# Patient Record
Sex: Female | Born: 1995 | Race: White | Hispanic: No | Marital: Single | Smoking: Never smoker
Health system: Southern US, Community
[De-identification: ages and names within clinical notes are randomized; demographics above are authoritative.]

## PROBLEM LIST (undated history)

## (undated) DIAGNOSIS — G43109 Migraine with aura, not intractable, without status migrainosus: Secondary | ICD-10-CM

## (undated) DIAGNOSIS — R87610 Atypical squamous cells of undetermined significance on cytologic smear of cervix (ASC-US): Secondary | ICD-10-CM

## (undated) DIAGNOSIS — N83209 Unspecified ovarian cyst, unspecified side: Secondary | ICD-10-CM

## (undated) DIAGNOSIS — J45909 Unspecified asthma, uncomplicated: Secondary | ICD-10-CM

## (undated) DIAGNOSIS — Z3480 Encounter for supervision of other normal pregnancy, unspecified trimester: Secondary | ICD-10-CM

## (undated) HISTORY — PX: DILATION AND CURETTAGE OF UTERUS: SHX78

## (undated) HISTORY — DX: Migraine with aura, not intractable, without status migrainosus: G43.109

## (undated) HISTORY — DX: Unspecified ovarian cyst, unspecified side: N83.209

---
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
## 2013-04-13 IMAGING — CR DG LUMBAR SPINE AP/LAT/OBLIQUES W/ FLEX AND EXT
1 series · 5 of 5 positions shown · non-contrast
Comparison: none

REASON FOR EXAM: back pain
COMMENTS:

PROCEDURE:     DXR - DXR LUMBAR SPINE WITH OBLIQUES  - September 07, 2011  [DATE]
RESULT:     Comparison: None.

[Series 1: t lumbar spine ap · 0.14mm/px · 5 of 5 slices shown]
[im 1/5]
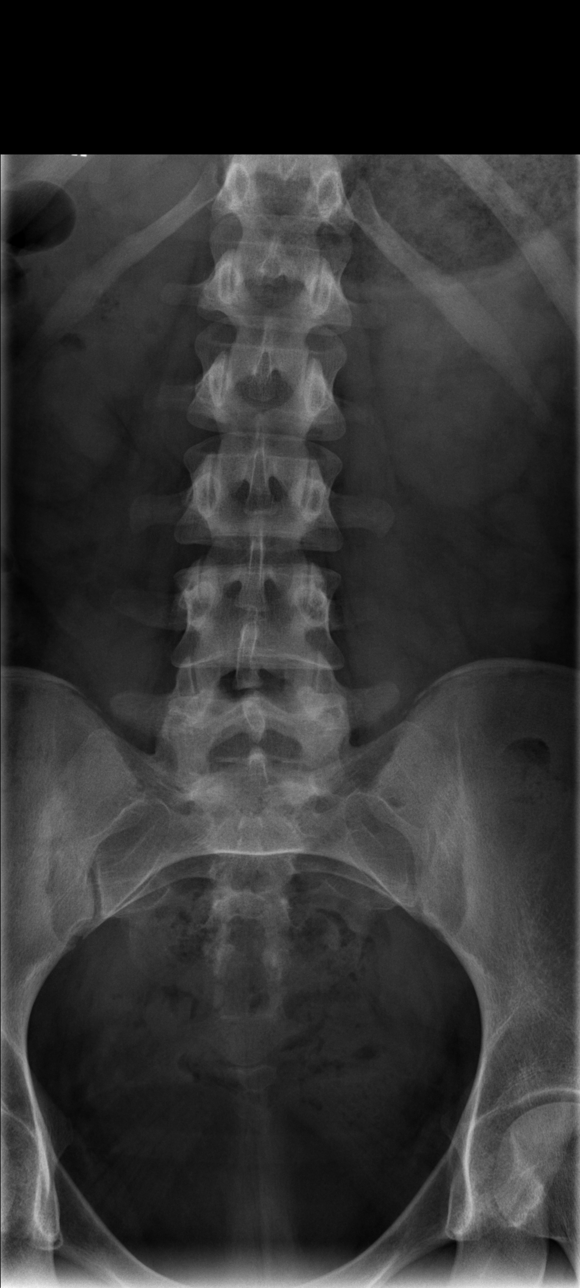
[im 2/5]
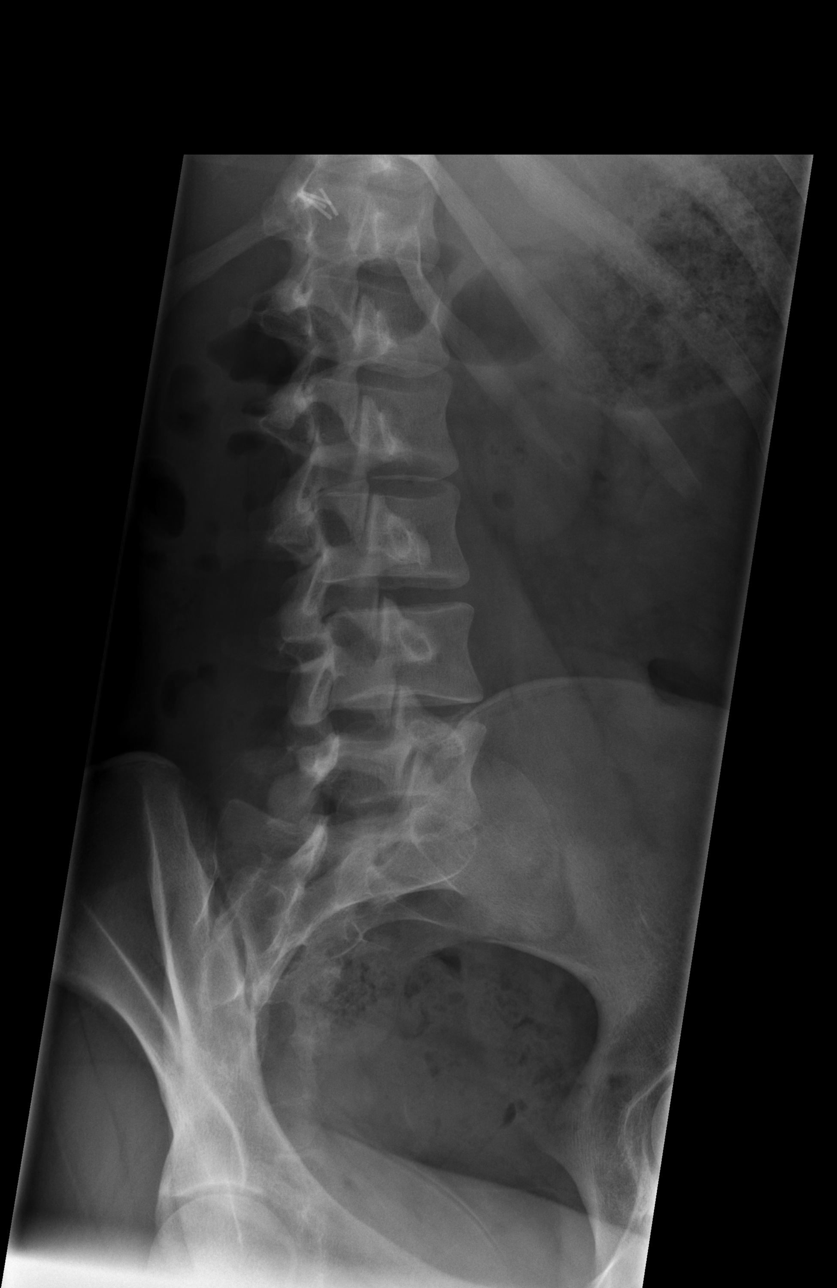
[im 3/5]
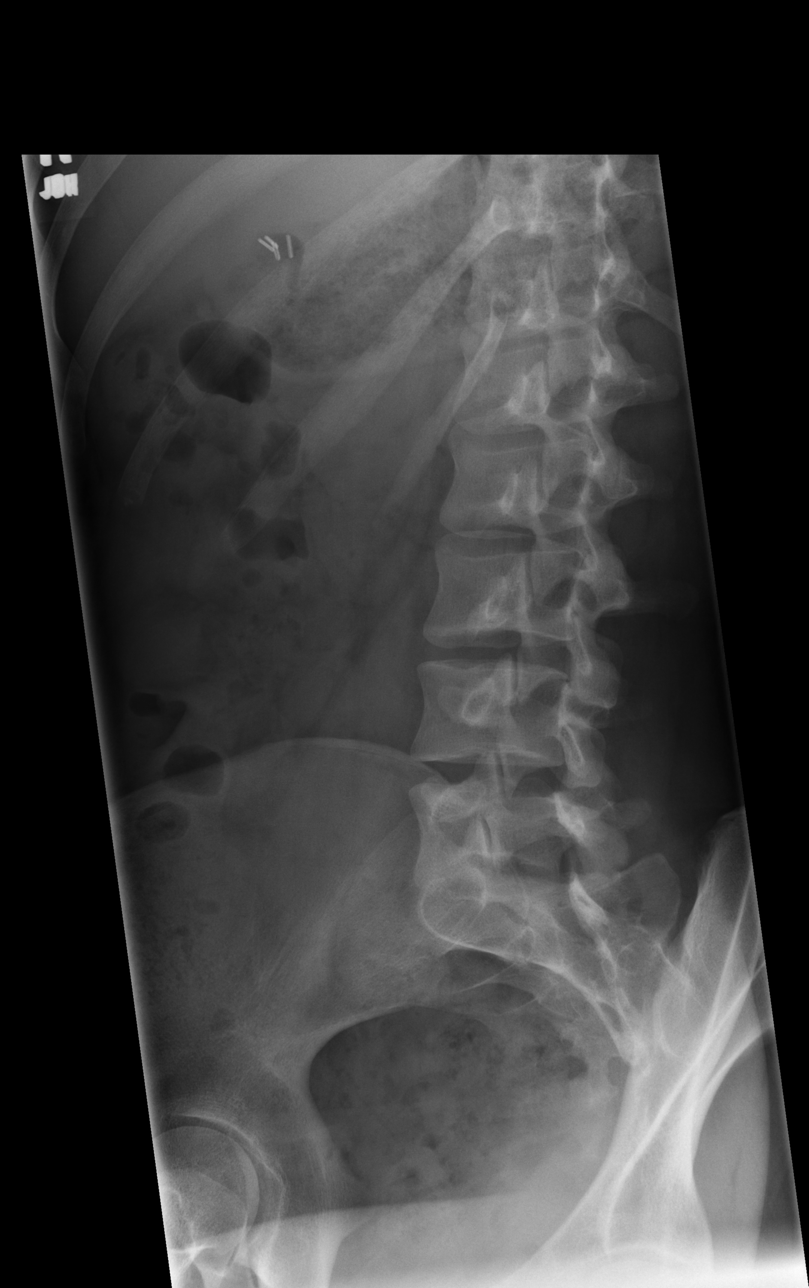
[im 4/5]
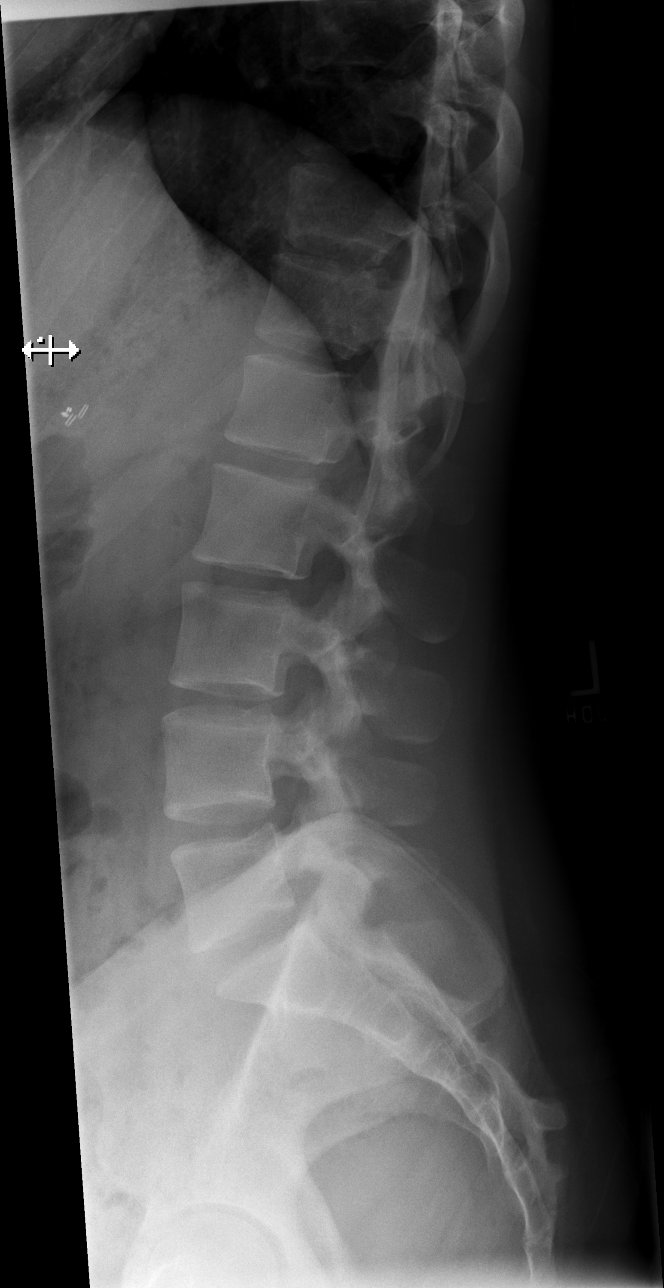
[im 5/5]
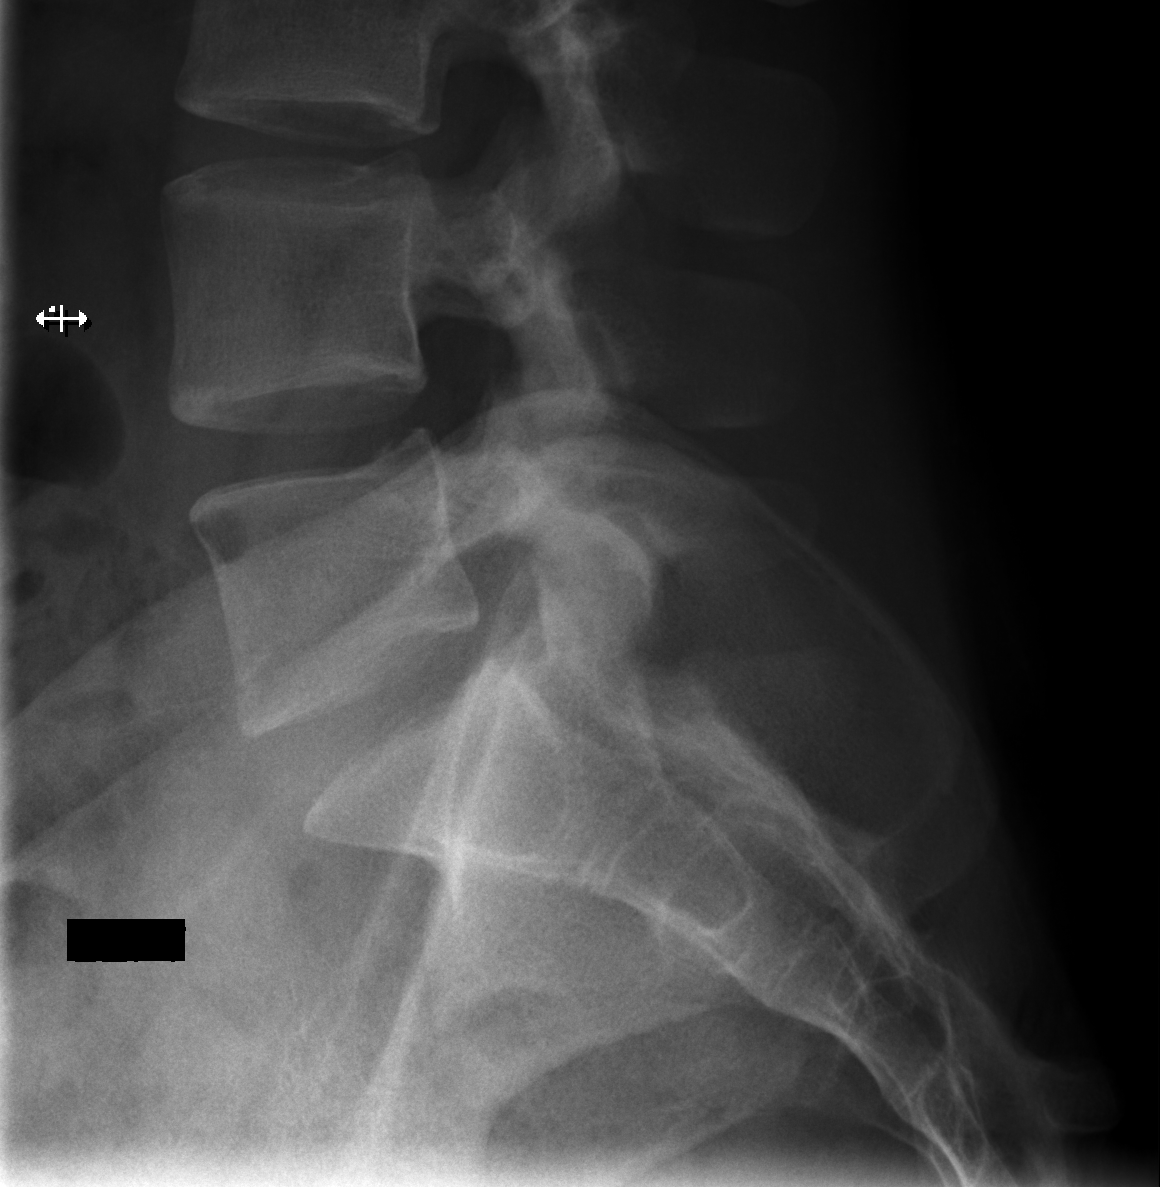

[5 of 5 positions shown; findings below may reference images not displayed]

FINDINGS: There are 5 lumbar type vertebral bodies. No pars defects seen on the
oblique views. Vertebral body heights and intervertebral disc heights are
preserved. There is normal alignment. There is a minimal dextrocurvature of
the lumbar spine, which may be positional.
IMPRESSION: No acute findings.

## 2013-04-27 ENCOUNTER — Inpatient Hospital Stay: Payer: Self-pay

## 2013-04-27 LAB — CBC WITH DIFFERENTIAL/PLATELET
Basophil %: 0.6 %
HCT: 31.4 % — ABNORMAL LOW (ref 35.0–47.0)
Lymphocyte #: 1.5 10*3/uL (ref 1.0–3.6)
Lymphocyte %: 22.4 %
MCH: 27.6 pg (ref 26.0–34.0)
MCV: 80 fL (ref 80–100)
Monocyte #: 0.5 x10 3/mm (ref 0.2–0.9)
WBC: 6.8 10*3/uL (ref 3.6–11.0)

## 2013-04-27 LAB — GC/CHLAMYDIA PROBE AMP

## 2013-04-28 LAB — HEMATOCRIT: HCT: 31.4 % — ABNORMAL LOW (ref 35.0–47.0)

## 2013-05-05 ENCOUNTER — Emergency Department: Payer: Self-pay | Admitting: Emergency Medicine

## 2013-05-06 LAB — COMPREHENSIVE METABOLIC PANEL
Bilirubin,Total: 0.5 mg/dL (ref 0.2–1.0)
Calcium, Total: 8.9 mg/dL — ABNORMAL LOW (ref 9.0–10.7)
Chloride: 106 mmol/L (ref 98–107)
EGFR (Non-African Amer.): >60
Glucose: 85 mg/dL (ref 65–99)
Osmolality: 284 (ref 275–301)
Potassium: 3.5 mmol/L (ref 3.5–5.1)
Sodium: 142 mmol/L (ref 136–145)
Total Protein: 7.2 g/dL (ref 6.4–8.6)

## 2013-05-06 LAB — CBC
HCT: 36.6 % (ref 35.0–47.0)
HGB: 12.4 g/dL (ref 12.0–16.0)
MCH: 27.8 pg (ref 26.0–34.0)
Platelet: 251 10*3/uL (ref 150–440)
RBC: 4.47 10*6/uL (ref 3.80–5.20)
RDW: 13.9 % (ref 11.5–14.5)
WBC: 6 10*3/uL (ref 3.6–11.0)

## 2013-05-06 LAB — URINALYSIS, COMPLETE
Bilirubin,UR: NEGATIVE
Blood: NEGATIVE
Ketone: NEGATIVE
Ph: 6 (ref 4.5–8.0)
Protein: NEGATIVE
RBC,UR: 2 /HPF (ref 0–5)
Specific Gravity: 1.027 (ref 1.003–1.030)
WBC UR: 3 /HPF (ref 0–5)

## 2013-05-06 LAB — WET PREP, GENITAL

## 2013-05-07 LAB — URINE CULTURE

## 2013-05-10 ENCOUNTER — Emergency Department: Payer: Self-pay | Admitting: Unknown Physician Specialty

## 2013-05-11 LAB — COMPREHENSIVE METABOLIC PANEL
Albumin: 3.7 g/dL — ABNORMAL LOW (ref 3.8–5.6)
Alkaline Phosphatase: 120 U/L (ref 82–169)
Anion Gap: 6 — ABNORMAL LOW (ref 7–16)
Calcium, Total: 9.3 mg/dL (ref 9.0–10.7)
Chloride: 102 mmol/L (ref 98–107)
Co2: 29 mmol/L (ref 21–32)
Creatinine: 0.67 mg/dL (ref 0.60–1.30)
EGFR (Non-African Amer.): >60
Glucose: 90 mg/dL (ref 65–99)
Osmolality: 275 (ref 275–301)
Potassium: 3.6 mmol/L (ref 3.5–5.1)

## 2013-05-11 LAB — URINALYSIS, COMPLETE
Bacteria: NONE SEEN
Ketone: NEGATIVE
Nitrite: NEGATIVE
Protein: NEGATIVE
RBC,UR: 1 /HPF (ref 0–5)

## 2013-05-11 LAB — LIPASE, BLOOD: Lipase: 144 U/L (ref 73–393)

## 2013-05-11 LAB — CBC
HCT: 37.3 % (ref 35.0–47.0)
HGB: 12.9 g/dL (ref 12.0–16.0)
MCHC: 34.7 g/dL (ref 32.0–36.0)
MCV: 80 fL (ref 80–100)
WBC: 5.7 10*3/uL (ref 3.6–11.0)

## 2013-09-07 DIAGNOSIS — N83209 Unspecified ovarian cyst, unspecified side: Secondary | ICD-10-CM

## 2013-09-07 HISTORY — DX: Unspecified ovarian cyst, unspecified side: N83.209

## 2013-09-18 ENCOUNTER — Ambulatory Visit: Payer: Self-pay | Admitting: Family Medicine

## 2013-09-22 ENCOUNTER — Emergency Department: Payer: Self-pay | Admitting: Emergency Medicine

## 2013-09-22 LAB — URINALYSIS, COMPLETE
Blood: NEGATIVE
Ketone: NEGATIVE
Leukocyte Esterase: NEGATIVE
Ph: 6 (ref 4.5–8.0)
Protein: NEGATIVE
RBC,UR: 1 /HPF (ref 0–5)
Specific Gravity: 1.027 (ref 1.003–1.030)

## 2013-09-22 LAB — COMPREHENSIVE METABOLIC PANEL
Albumin: 3.6 g/dL — ABNORMAL LOW (ref 3.8–5.6)
Alkaline Phosphatase: 41 U/L — ABNORMAL LOW
Bilirubin,Total: 0.8 mg/dL (ref 0.2–1.0)
Calcium, Total: 8.9 mg/dL — ABNORMAL LOW (ref 9.0–10.7)
Chloride: 107 mmol/L (ref 98–107)
Co2: 28 mmol/L (ref 21–32)
Creatinine: 0.64 mg/dL (ref 0.60–1.30)
EGFR (African American): >60
EGFR (Non-African Amer.): >60
Glucose: 112 mg/dL — ABNORMAL HIGH (ref 65–99)
Osmolality: 278 (ref 275–301)
Potassium: 3.6 mmol/L (ref 3.5–5.1)
SGOT(AST): 18 U/L (ref 0–26)
SGPT (ALT): 19 U/L (ref 12–78)
Sodium: 139 mmol/L (ref 136–145)
Total Protein: 7 g/dL (ref 6.4–8.6)

## 2013-09-22 LAB — CBC WITH DIFFERENTIAL/PLATELET
Basophil #: 0 10*3/uL (ref 0.0–0.1)
Basophil %: 0.6 %
Eosinophil %: 1 %
MCH: 28.2 pg (ref 26.0–34.0)
MCHC: 34.6 g/dL (ref 32.0–36.0)
MCV: 82 fL (ref 80–100)
Neutrophil #: 4 10*3/uL (ref 1.4–6.5)
Platelet: 186 10*3/uL (ref 150–440)
RBC: 4.51 10*6/uL (ref 3.80–5.20)
RDW: 13.4 % (ref 11.5–14.5)
WBC: 6.8 10*3/uL (ref 3.6–11.0)

## 2013-10-22 IMAGING — US US PELV - US TRANSVAGINAL
1 series · 13 of 25 positions shown · non-contrast
Comparison: none

REASON FOR EXAM: RECENT MISCARRIAGE W/ VAG BLEEDING; EVAL RETAINED POC
COMMENTS:   May transport without cardiac monitor

PROCEDURE:     US  - US PELVIS EXAM W/TRANSVAGINAL  - March 17, 2012  [DATE]
RESULT:
TECHNIQUE: Transabdominal and endovaginal sonographic imaging was performed.

[Series 1: us pelv - us transvaginal · 0.26mm/px · 13 of 72 slices shown]
[im 1/72]
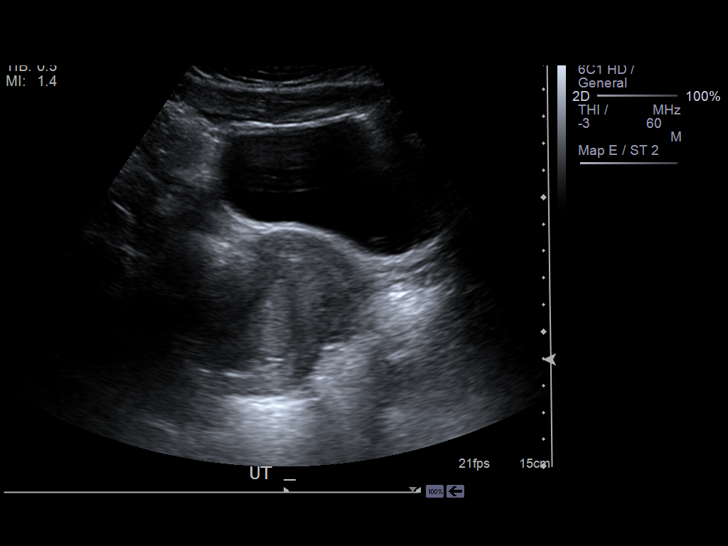
[im 6/72]
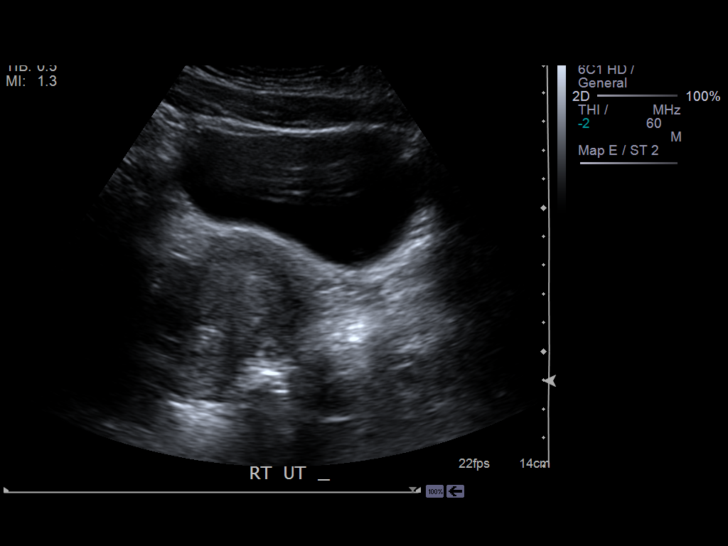
[im 12/72]
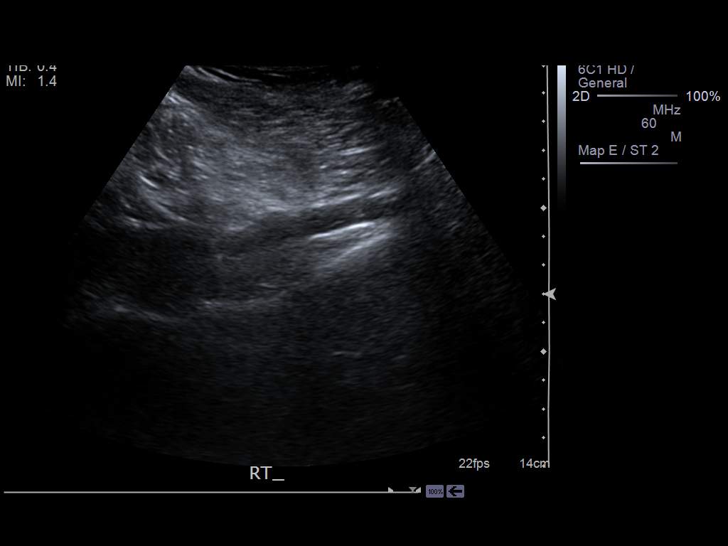
[im 18/72]
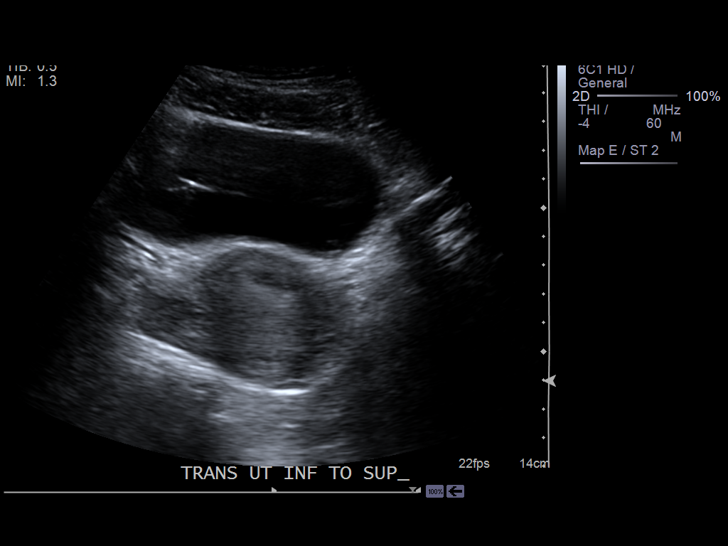
[im 24/72]
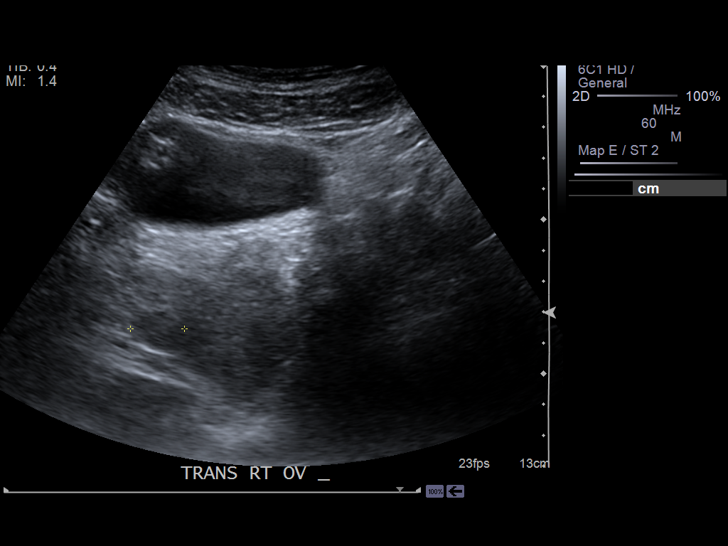
[im 30/72]
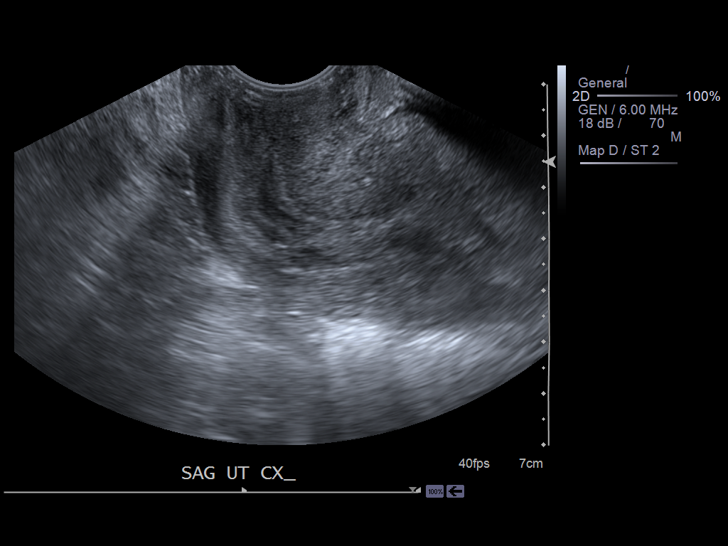
[im 36/72]
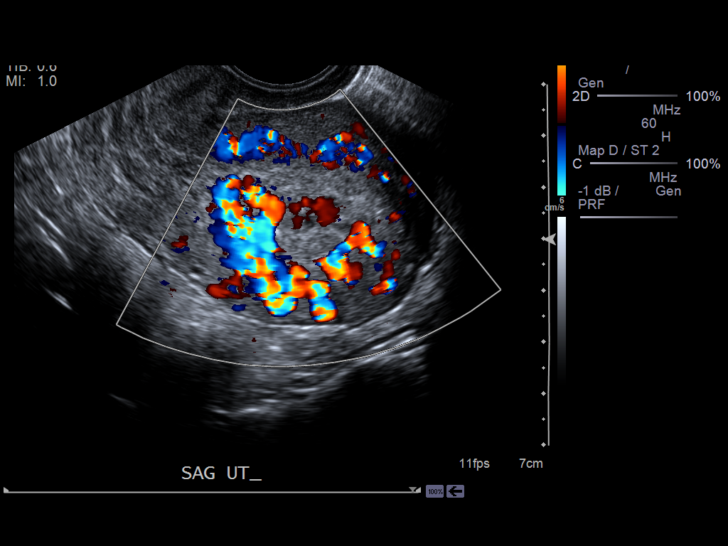
[im 42/72]
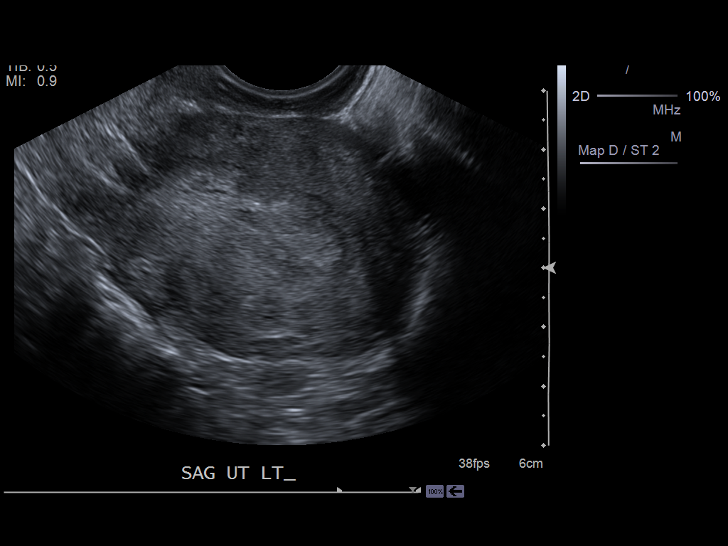
[im 48/72]
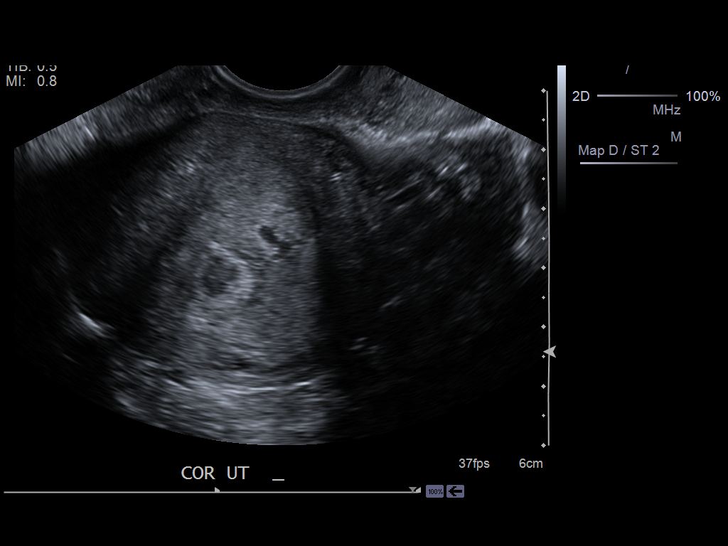
[im 54/72]
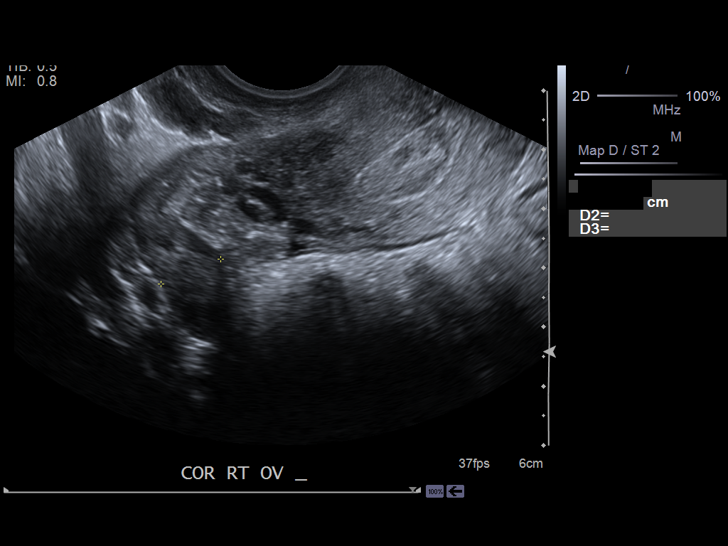
[im 60/72]
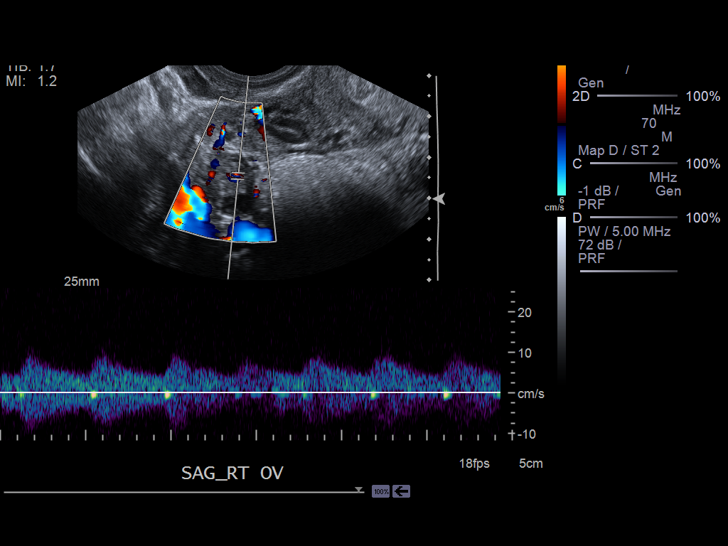
[im 66/72]
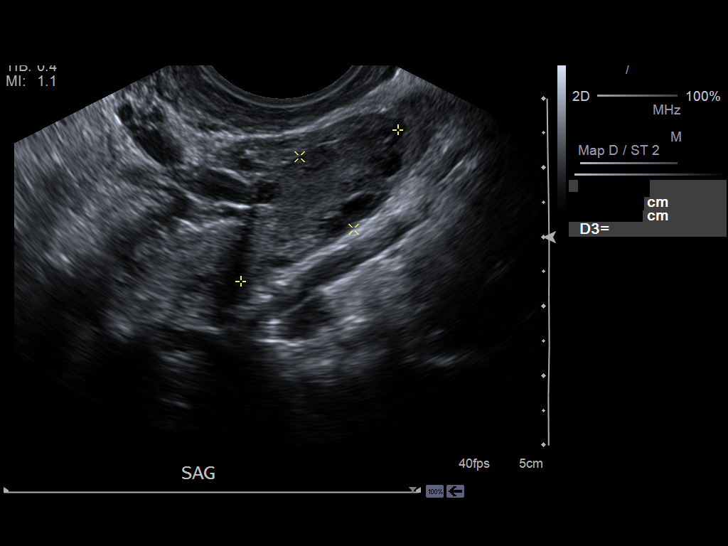
[im 72/72]
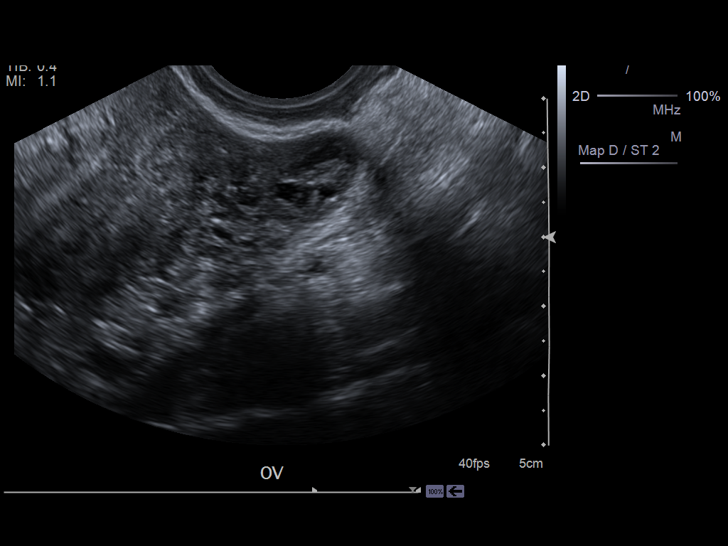

[13 of 25 positions shown; findings below may reference images not displayed]

FINDINGS: The uterus measures 6 cm in length. There is an abnormally
thickened heterogeneous and hypervascular endometrium. The endometrium
measures 2.1 cm. No discrete gestational sac is appreciated. The endometrial
and subendometrial tissues may represent residual products of conception
versus gestational trophoblastic disease. The possibility of endometriosis
is also of diagnostic consideration, if clinically appropriate.

The ovaries are unremarkable. The follicles are appreciated. Doppler flow is
identified within each ovary. Trace free fluid is identified within the
pelvis. There is no evidence of hydronephrosis.
IMPRESSION: 1.     Thickened heterogeneous, hyperemic endometrium. Differential
considerations are retained products of conception. No gestational sac is
appreciated. Etiology such as gestational trophoblastic disease or
endometrioma cannot be excluded, if clinically appropriate.
2.     Dr. Siebelo of the Emergency Department was informed of these findings
via a preliminary faxed report.

## 2014-01-26 IMAGING — US US OB < 14 WEEKS - US OB TV
1 series · 14 of 28 positions shown · non-contrast
Comparison: none

REASON FOR EXAM: 16 y/o F, likely miscarriage,  HCG dropping from 169 two
days ago to 57 today.
COMMENTS:

PROCEDURE:     US  - US OB LESS THAN 14 WEEKS/W TRANS  - June 21, 2012  [DATE]
RESULT:     Comparison: 03/17/2012
TECHNIQUE: Multiple grayscale and color Doppler images were obtained of the
pelvis via transabdominal and endovaginal ultrasound.

[Series 1: us ob < 14 weeks - us ob tv · 0.25mm/px · 14 of 64 slices shown]
[im 3/64]
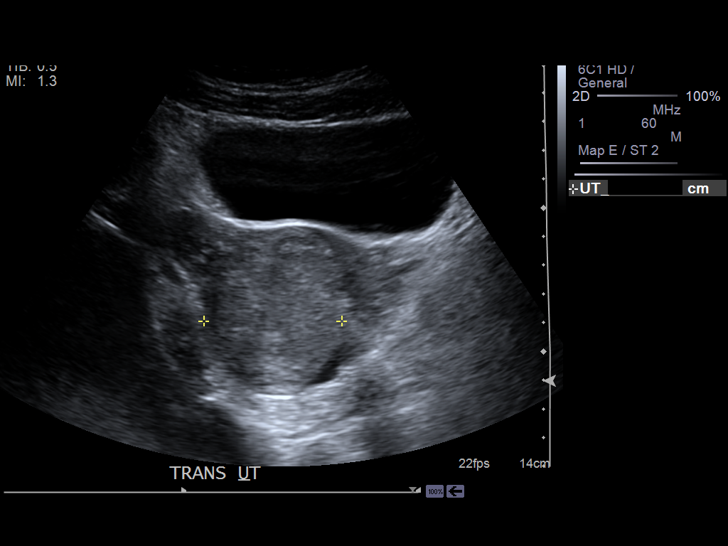
[im 8/64]
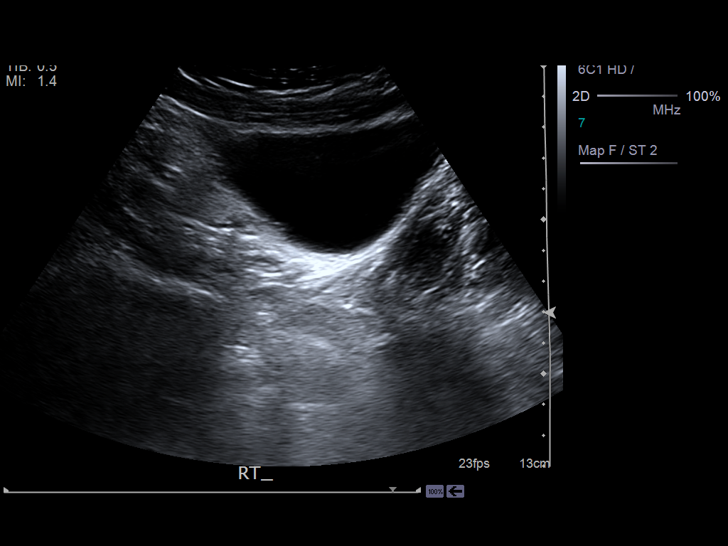
[im 12/64]
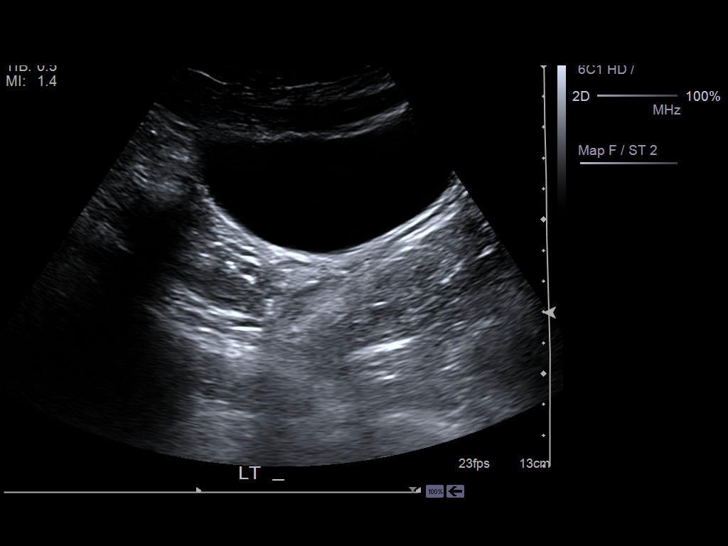
[im 17/64]
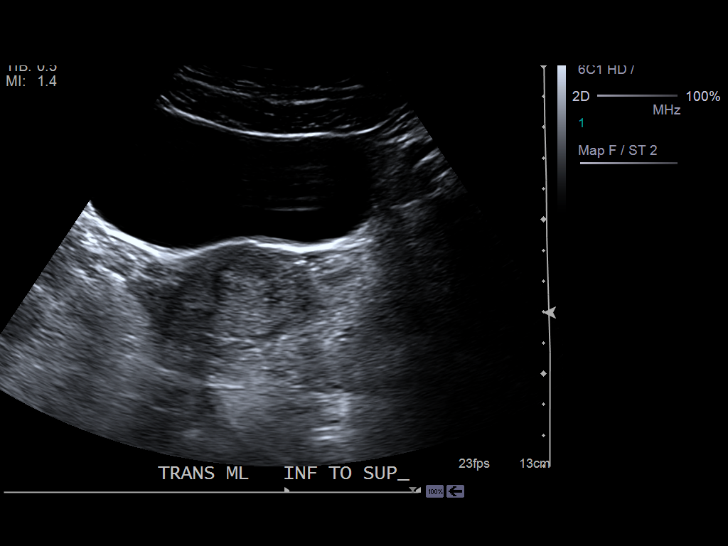
[im 22/64]
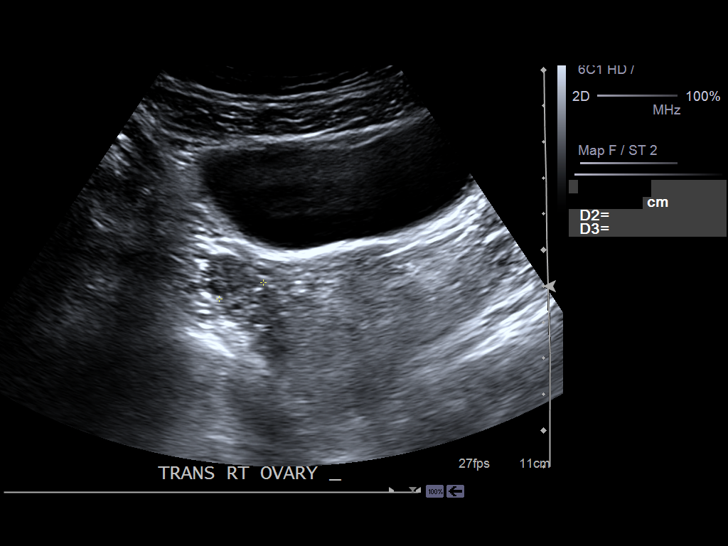
[im 26/64]
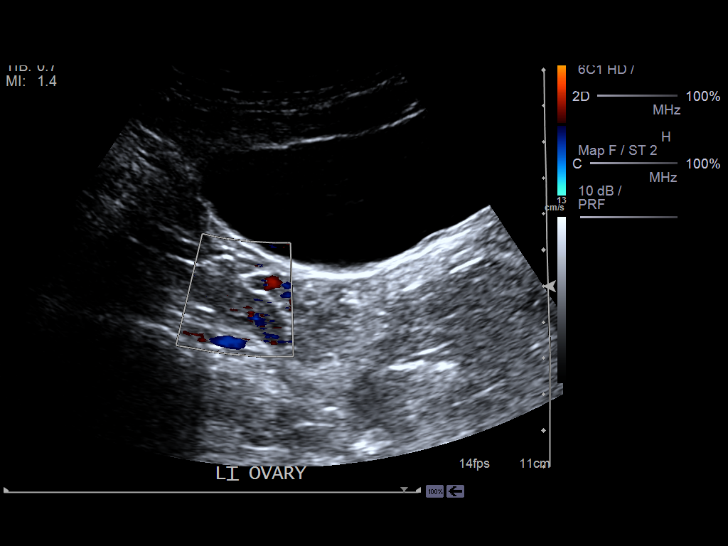
[im 31/64]
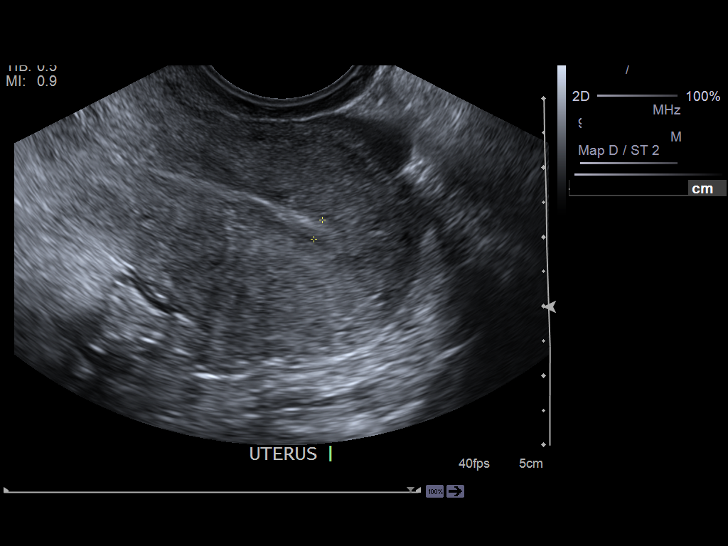
[im 36/64]
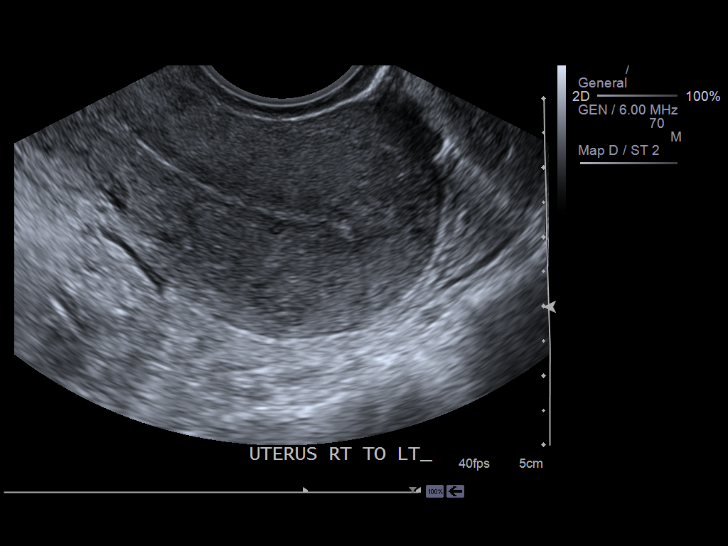
[im 40/64]
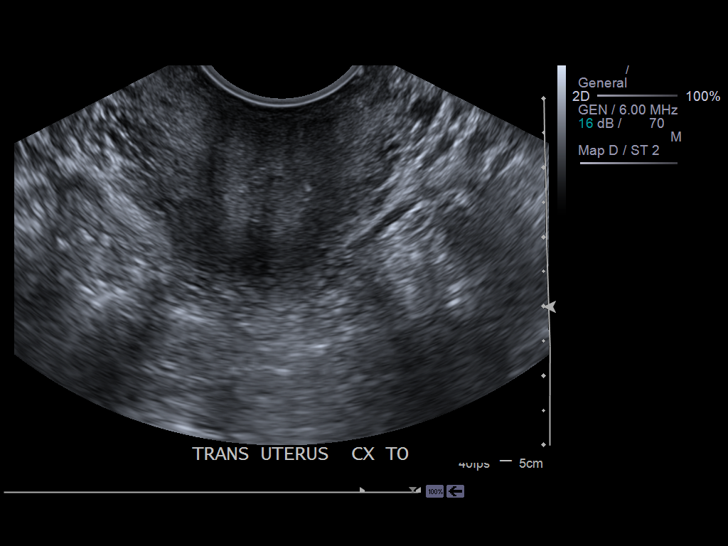
[im 45/64]
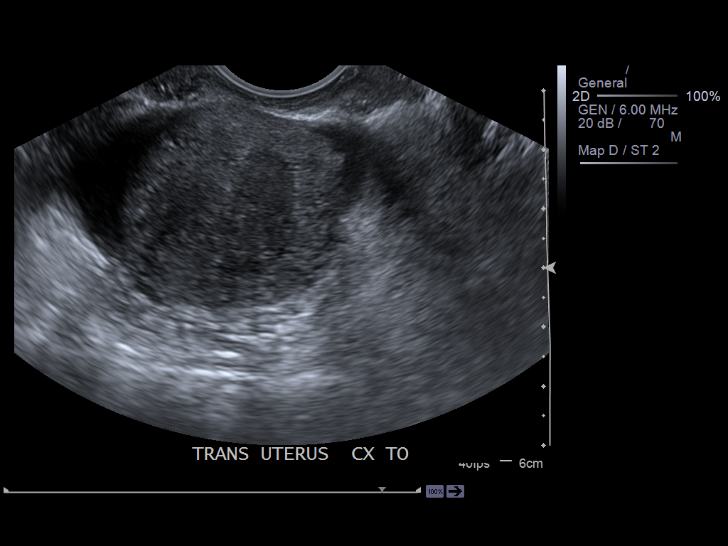
[im 50/64]
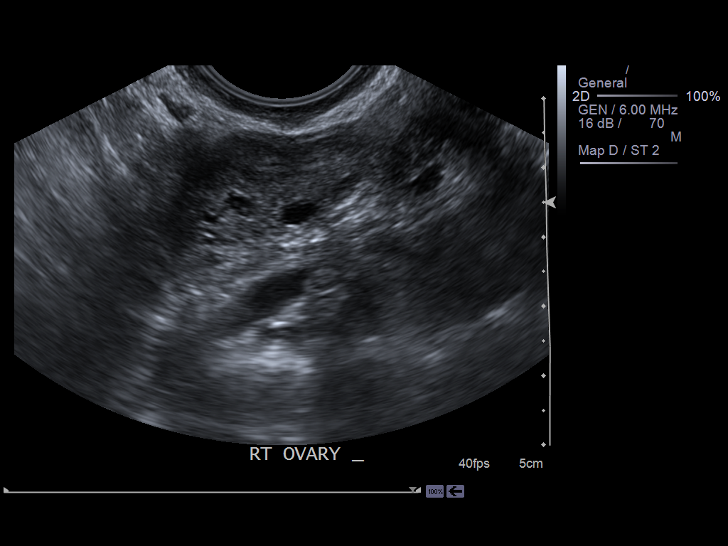
[im 54/64]
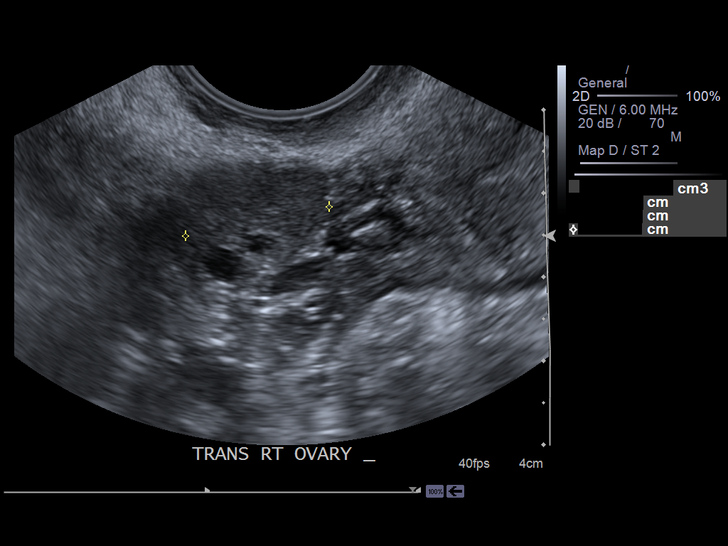
[im 59/64]
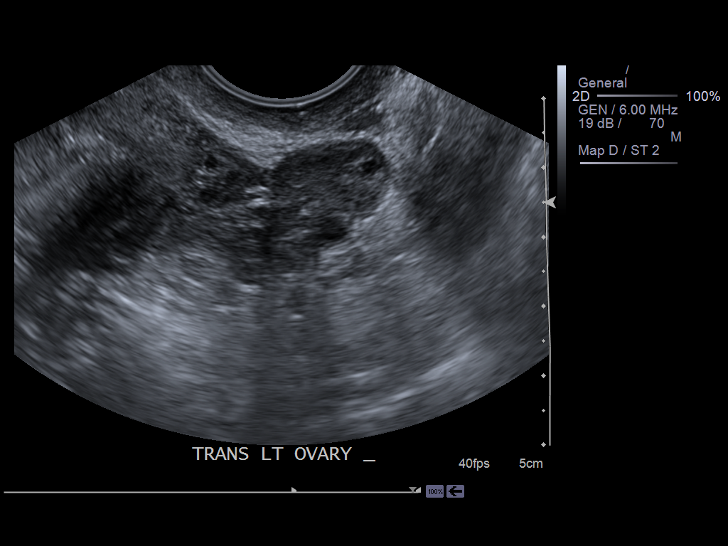
[im 64/64]
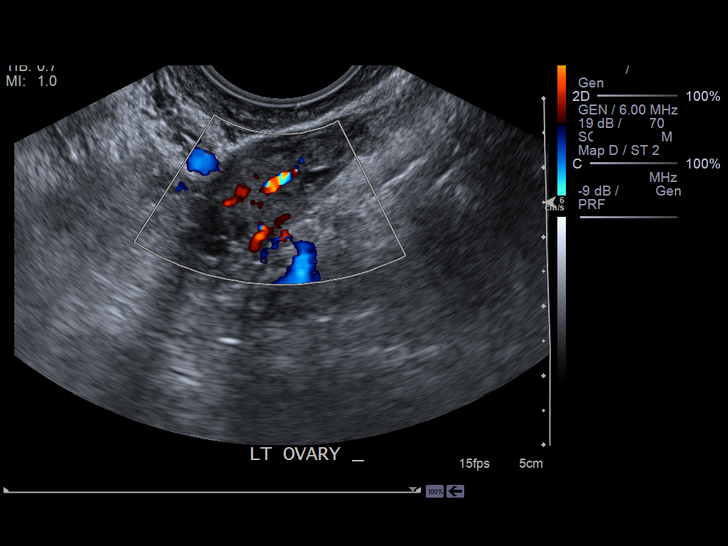

[14 of 28 positions shown; findings below may reference images not displayed]

FINDINGS: The uterus measures 5.8 x 4.8 x 3.8 cm. The uterus is retroverted. The
endometrial stripe measures 4 mm. Note intrauterine pregnancy or gestational
sac identified. There is a trace amount of free fluid in the cul-de-sac,
which may be physiologic.

The right ovary measures 2.4 x 1.8 x 1.8 cm. The left ovary measures 2.4 x
1.7 x 1.3 cm. Color Doppler flow is associated with the bilateral ovaries.
Spectral Doppler imaging was not performed.
IMPRESSION: No intrauterine pregnancy identified. Given the positive beta HCG,
differential includes early intrauterine pregnancy, failed intrauterine
pregnancy, and ectopic pregnancy.  Recommend close clinical followup, serial
quantitative beta HCGs, and followup ultrasound as clinically indicated.

[REDACTED]

## 2014-02-09 ENCOUNTER — Emergency Department: Payer: Self-pay | Admitting: Emergency Medicine

## 2014-02-09 LAB — CBC
HCT: 38.9 % (ref 35.0–47.0)
HGB: 13.1 g/dL (ref 12.0–16.0)
MCH: 28.7 pg (ref 26.0–34.0)
MCHC: 33.8 g/dL (ref 32.0–36.0)
MCV: 85 fL (ref 80–100)
Platelet: 220 10*3/uL (ref 150–440)
RBC: 4.58 10*6/uL (ref 3.80–5.20)
RDW: 12.8 % (ref 11.5–14.5)
WBC: 6.3 10*3/uL (ref 3.6–11.0)

## 2014-02-09 LAB — URINALYSIS, COMPLETE
Bacteria: NONE SEEN
Bilirubin,UR: NEGATIVE
Glucose,UR: NEGATIVE mg/dL (ref 0–75)
Ketone: NEGATIVE
LEUKOCYTE ESTERASE: NEGATIVE
Nitrite: NEGATIVE
Ph: 6 (ref 4.5–8.0)
Protein: NEGATIVE
Specific Gravity: 1.03 (ref 1.003–1.030)
Squamous Epithelial: 1
WBC UR: 1 /HPF (ref 0–5)

## 2014-02-09 LAB — COMPREHENSIVE METABOLIC PANEL
ALBUMIN: 3.9 g/dL (ref 3.4–5.0)
ALT: 27 U/L (ref 12–78)
Alkaline Phosphatase: 47 U/L
Anion Gap: 5 — ABNORMAL LOW (ref 7–16)
BUN: 10 mg/dL (ref 7–18)
Bilirubin,Total: 0.9 mg/dL (ref 0.2–1.0)
CREATININE: 0.64 mg/dL (ref 0.60–1.30)
Calcium, Total: 8.6 mg/dL (ref 8.5–10.1)
Chloride: 107 mmol/L (ref 98–107)
Co2: 26 mmol/L (ref 21–32)
EGFR (African American): >60
Glucose: 95 mg/dL (ref 65–99)
OSMOLALITY: 275 (ref 275–301)
Potassium: 3.7 mmol/L (ref 3.5–5.1)
SGOT(AST): 25 U/L (ref 15–37)
Sodium: 138 mmol/L (ref 136–145)
Total Protein: 7.2 g/dL (ref 6.4–8.2)

## 2014-02-09 LAB — LIPASE, BLOOD: Lipase: 105 U/L (ref 73–393)

## 2014-02-09 LAB — WET PREP, GENITAL

## 2014-02-09 LAB — GC/CHLAMYDIA PROBE AMP

## 2014-05-17 IMAGING — US US OB < 14 WEEKS
2 series · 14 of 28 positions shown · non-contrast
Comparison: none

REASON FOR EXAM: Abdominal pain
COMMENTS:   May transport without cardiac monitor

PROCEDURE:     US  - US OB LESS THAN 14 WEEKS  - October 10, 2012 [DATE]
RESULT:     First trimester OB ultrasound dated 10/10/2002

[Series 1: us ob < 14 weeks · 0.26mm/px · 31 acquisitions, 13 frames shown (1 of 2)]
[im 2/31]
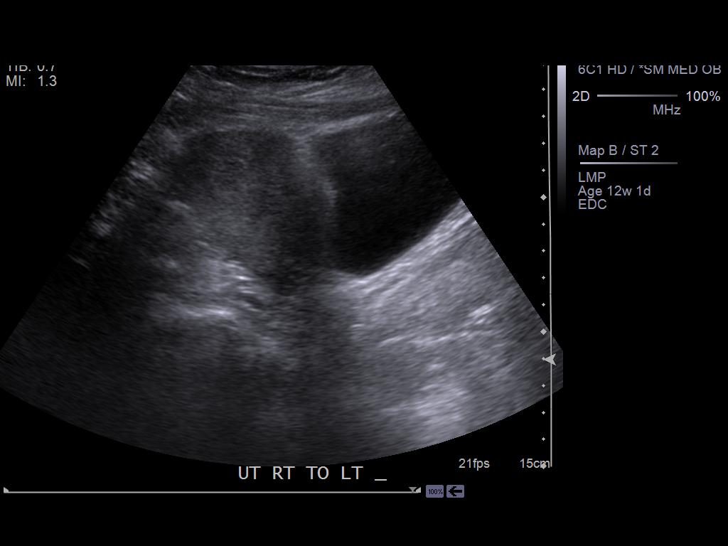
[im 4/31]
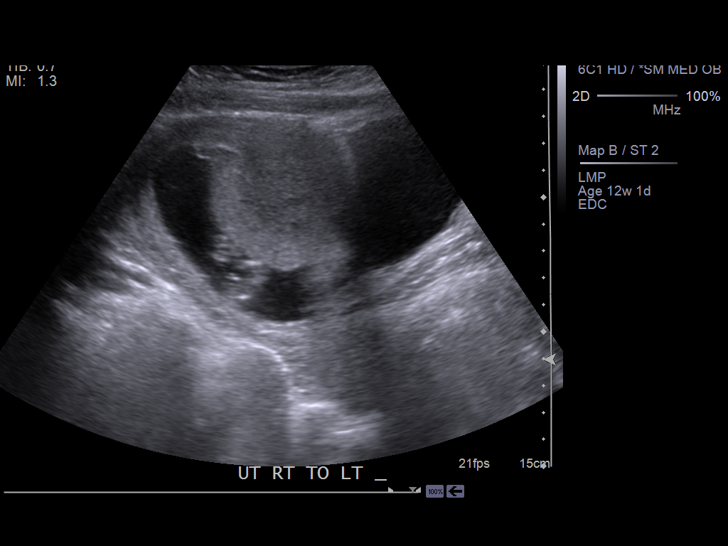
[im 7/31]
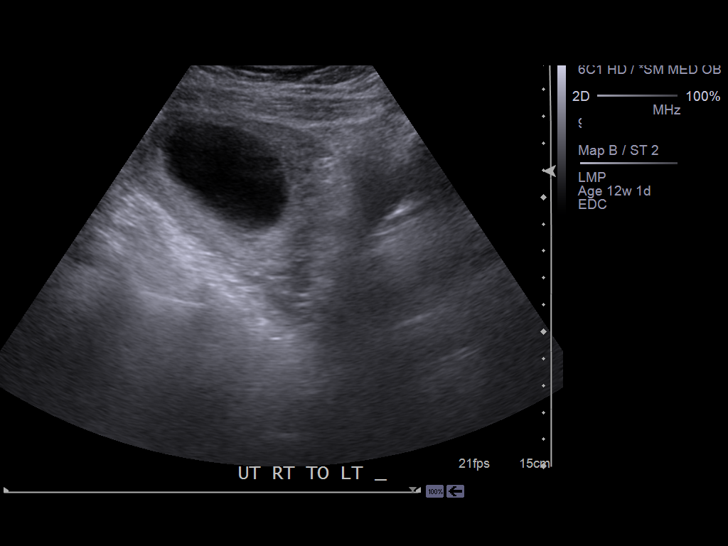
[im 9/31]
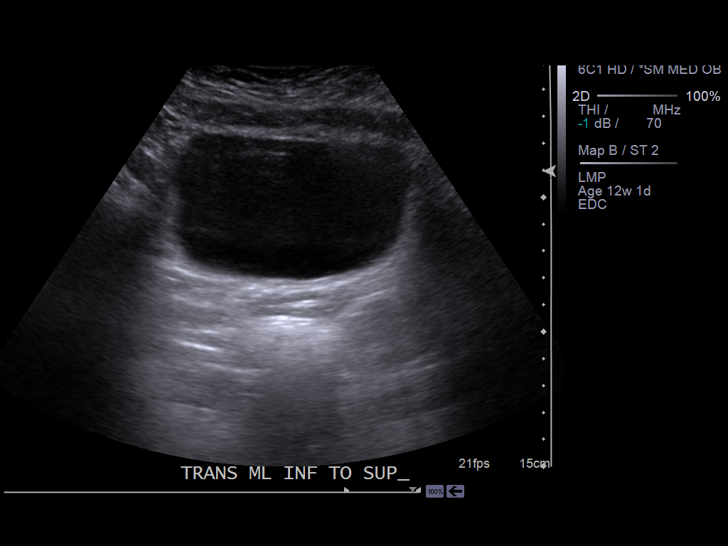
[im 11/31]
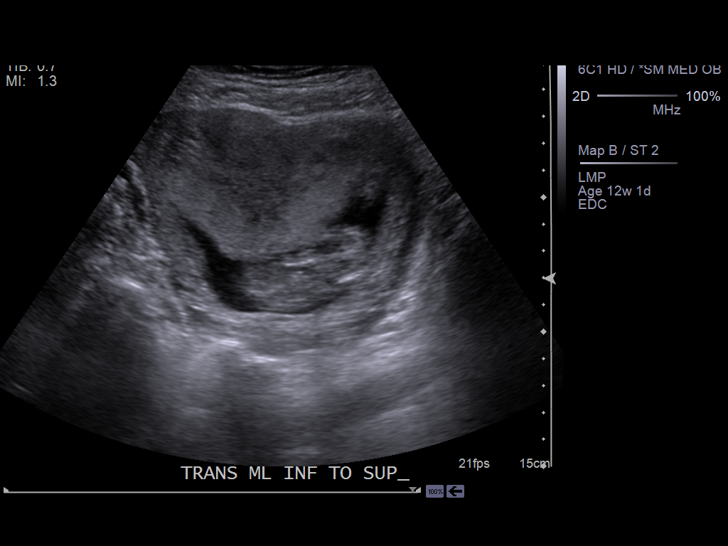
[im 14/31]
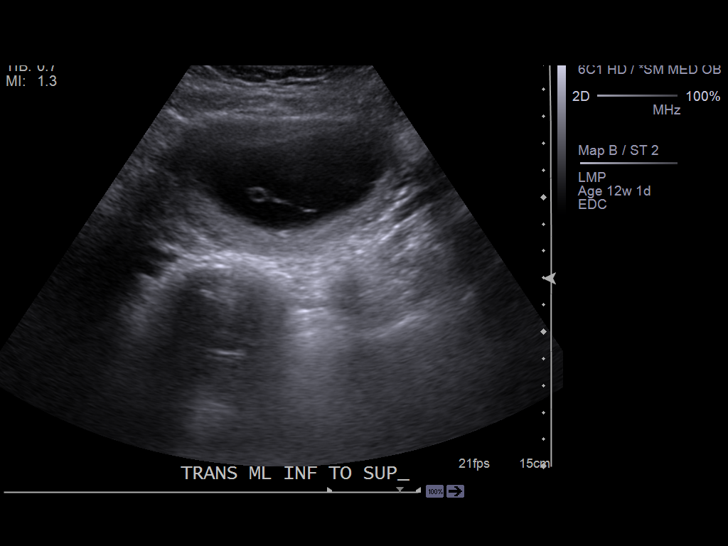
[im 16/31]
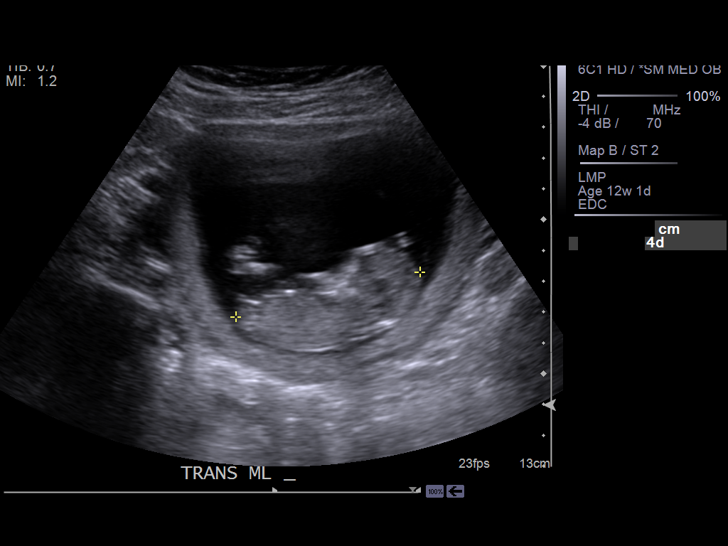
[im 19/31]
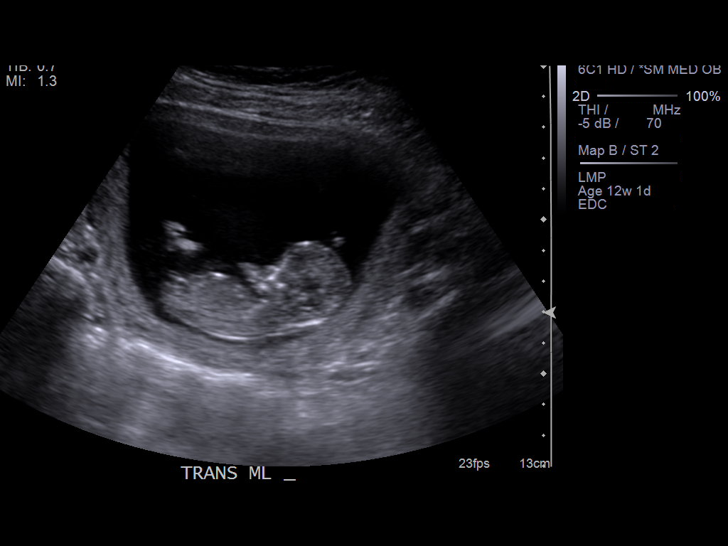
[im 21/31]
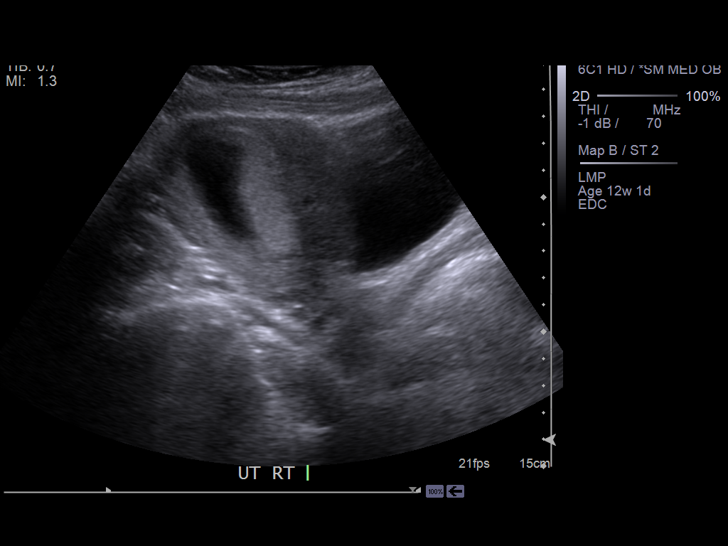
[im 23/31]
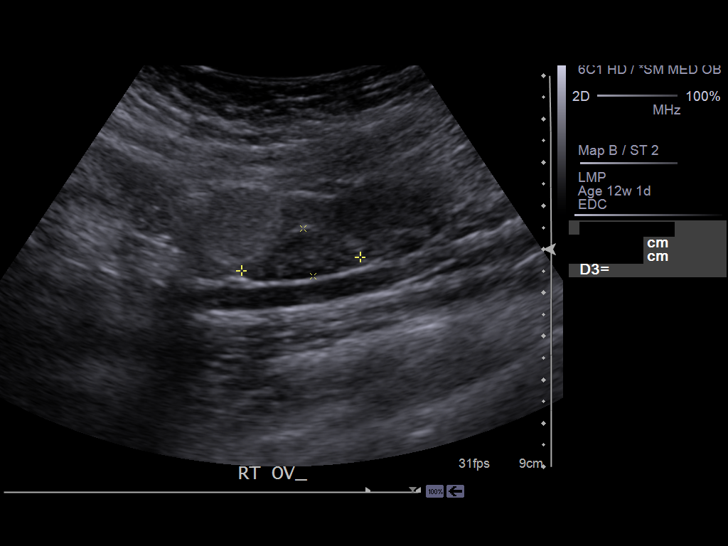
[im 26/31]
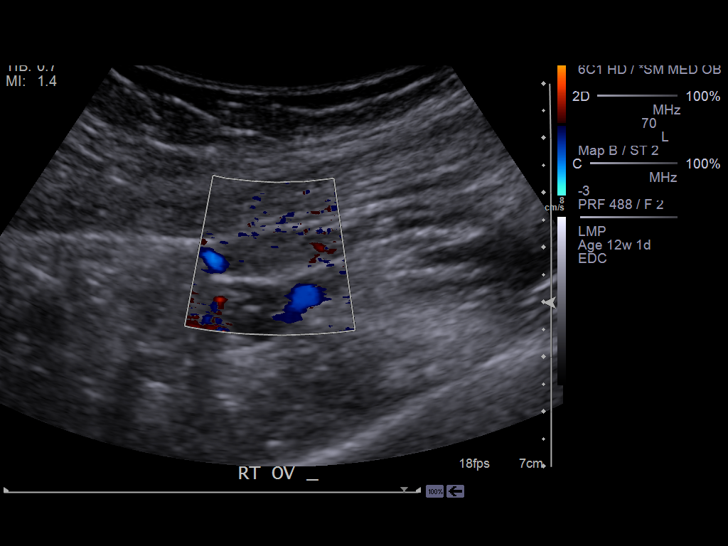
[im 28/31]
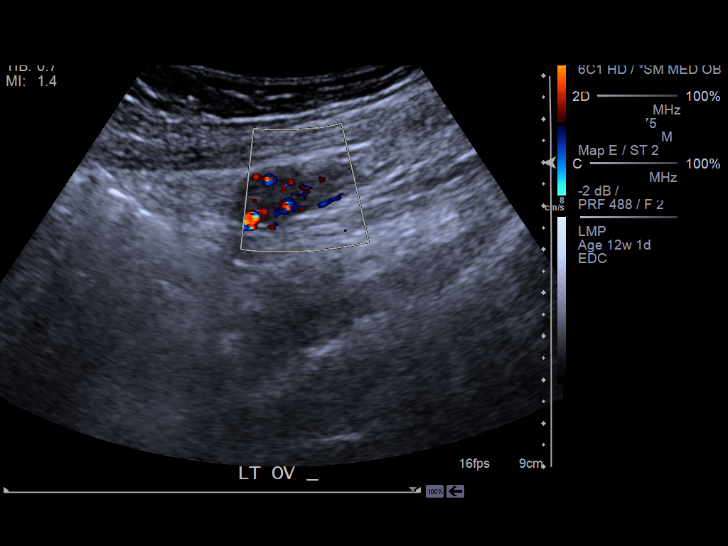
[im 31/31]
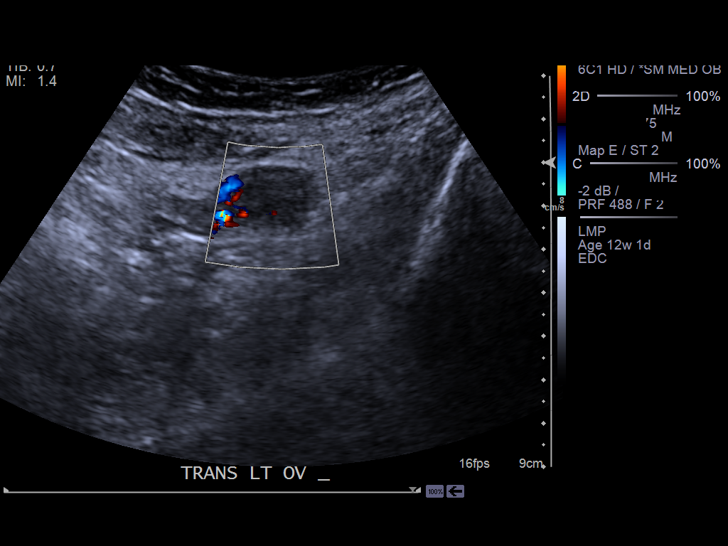

[Series 1001: us ob < 14 weeks · 0.26mm/px · 1 of 2 slices shown (2 of 2)]
[im 2/2]
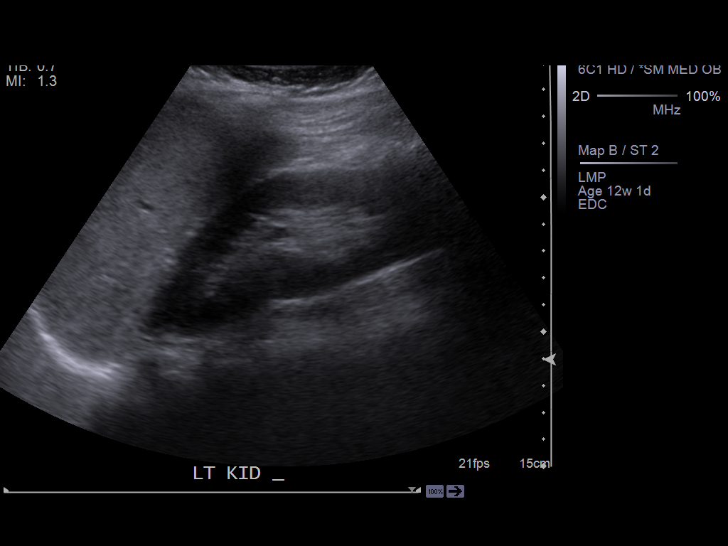

[14 of 28 positions shown; findings below may reference images not displayed]

FINDINGS: Estimated gestational age 12 weeks 4 days based on ultrasound.
Estimated due date is 04/20/2013. Estimated fetal heart rate 150 beats per
minute. No evidence of subchorionic hemorrhage. The placenta is without
acute abnormality. Internal ovaries unremarkable. Is no evidence of adnexal
masses. Is no evidence of pelvic free fluid loculated fluid collections.
There is no evidence of hydronephrosis.
IMPRESSION: Single live intrauterine pregnancy as described above.
2. Dr. Mukwaudimbe of the emergency department was informed of these findings via a
preliminary faxed report.

## 2015-01-30 NOTE — Consult Note (Signed)
Brief Consult Note: Diagnosis: Incomplete abortion vs endometritis.   Patient was seen by consultant.   Consult note dictated.   Recommend further assessment or treatment.   Orders entered.   Discussed with Attending MD.   Comments: Patient s/p induced abortion with misoprostil followig diagnosis of missed abortion on 02/21/2012.  Continued bleeding since that time.  Was seen on 03/17/2012 for same complaint and had clinic follow up today but rescheduled for 03/24/2012.  H&H stable, vital signs stable no white count, and declining beta HCG level. Some tenderness on exam so will treat for presumptive endometritis ceftriaxone 250mg  IM once and doxycyline 100mg  po bid and flagyl 500mg  po bid x 10 days.  Routine bleeding precuations given.  Electronic Signatures: Lorrene ReidStaebler, Debbra Digiulio M (MD)  (Signed 13-Jun-13 23:59)  Authored: Brief Consult Note   Last Updated: 13-Jun-13 23:59 by Lorrene ReidStaebler, Caoilainn Sacks M (MD)

## 2015-01-30 NOTE — Op Note (Signed)
PATIENT NAME:  Eugenie FillerGERRINGER, Makaya N MR#:  [id number] DATE OF BIRTH:  Nov 01, 1995  DATE OF PROCEDURE:  03/25/2012  PREOPERATIVE DIAGNOSIS: Incomplete abortion.   POSTOPERATIVE DIAGNOSIS: Incomplete abortion.   PROCEDURE PERFORMED: Completion curettage.   SURGEON: Deloris Pinghilip J. Luella Cookosenow, MD  OPERATIVE FINDINGS: Small amount of tissue.   DESCRIPTION OF PROCEDURE: After adequate general anesthesia, patient prepped and draped in routine fashion. Cervix was grasped with Gerilyn PilgrimJacob tenaculum. It was already dilated. Uterine cavity was systematically suction curetted with a #8 suction curette with return of a small amount of tissue. Controlled curettage revealed a "clean" field for 360 degrees. Patient tolerated procedure well, left the Operating Room in good condition. Sponge and needle counts were said to be correct at end of the procedure.   ____________________________ Deloris PingPhilip J. Luella Cookosenow, MD pjr:cms D: 03/25/2012 10:16:42 ET T: 03/25/2012 11:26:27 ET JOB#: [id number]  cc: Deloris PingPhilip J. Luella Cookosenow, MD, <Dictator> Towana BadgerPHILIP J Andyn Sales MD ELECTRONICALLY SIGNED 03/26/2012 6:11

## 2015-01-30 NOTE — Consult Note (Signed)
Consulting Department: ED Consulting Physician: Joseph ArtFinnell, [name]  Consult Question: Miscarriage 02/21/2012 continued bleeding  History of Present Illness:  19 yo G2P1001 s/p induced missed abotion for missed [facility] [place].  Her LMP preceeding the miscarriage was 10/07/2012.  She is also s/p a recent vaginal delivery in July of 2012.  The patient reports that since her miscarriage she has had continued bleeding at times heavy with passage of clot.  At it's heavies she has been going through up to 1 pad an hour.  Pads are brough wiht her not completely soaked on inspection. She is not currently on an form of contraception.  Associated symptoms have included cramping, occasional fevers and chills but no actually reported temperatures.  She had follow up in clinic today but rescheduled for 03/24/2012. of Systems: 10 point review of systems negative unless otherwise noted in HPI  Past Medical History: none  Past Surgical History:Laproscopic cholecystectomy  Past Obstetric HIstory: G2P1011, TSVD x 1, SAB x 1  Past Gynecologic History: No prior pap smear as under <21, no history of STD's  Family History: non-contributory  Social HIstory: Denies tobacco, EtOH, or illlicit drug use  Medications: none  Allergies: toradol and tramadol Exam:vital signs stableNADnormocephalic, anictericRegular rate and rythmno increased work of breathingsoft, non-tender, non-distended, no organomegallyExam: normal external female genitalia, normal vaginal mcuosa without lesions. normal appearing cervix, scan blood at cervical os minimal active bleeding noted, approximately 1 scopet of old blood in vaginal vault. Bimanual exam revals anteverted, non-enlarged uterus, mild uterine tenderness is noted, no adnexal masses or tendernes on exam, closed cervix.Marland Kitchen.no inguinal lymphadnopathyno edema  Laboratory06/07/2012 118306/13/2013 50506/07/2012 WBC 7.5, Hgb 13.1, HCT 38.2, platelets  226  Ultrasound 06/10/2013heterogenous endometrium otherwise unremarkable scan  Assessment: 19 yo G2P1011 with incomplete abortion vs endometritis  Plan:patent is currently hemodynamically stable, with minimal active bleeding, stable H&H, and declinig BHCG.  At this point she does not warrant an emergency D&C but I sugest she keep her follow up appointment on 03/24/2012 that she missed today so that she can be re-evaluated an possibly set up for a scheduled D&C should she have continued bleeding.  Will treat for presumptive endometritis given subejective fevers, chills, and mild uterine tenderness on exam with ceftriaxone 250mg  IM once prior to discharge and a 10 day course of doxycycline, flagyl.  She will return for repeat H&H and BHCG draw in the ER on 03/23/2012 as this should aid in decision on wheter or not to proceed with D&C.  Routine bleeding precautions given. [name], MD06/13/2013 WBC 7.4, Hgb 12.9, HCT 38.6, platelets 228     Electronic Signatures: Lorrene ReidStaebler, Lonny Eisen M (MD)  (Signed on 14-Jun-13 00:41)  Authored  Last Updated: 14-Jun-13 00:41 by Lorrene ReidStaebler, Shakina Choy M (MD)

## 2015-04-07 ENCOUNTER — Encounter: Payer: Self-pay | Admitting: Intensive Care

## 2015-04-07 ENCOUNTER — Emergency Department
Admission: EM | Admit: 2015-04-07 | Discharge: 2015-04-07 | Disposition: A | Discharge status: Home / Self Care | Payer: Medicaid Other | Attending: Emergency Medicine | Admitting: Emergency Medicine

## 2015-04-07 ENCOUNTER — Emergency Department: Payer: Medicaid Other

## 2015-04-07 DIAGNOSIS — N8320 Unspecified ovarian cysts: Secondary | ICD-10-CM | POA: Diagnosis not present

## 2015-04-07 DIAGNOSIS — R102 Pelvic and perineal pain: Secondary | ICD-10-CM | POA: Diagnosis present

## 2015-04-07 DIAGNOSIS — R300 Dysuria: Secondary | ICD-10-CM | POA: Insufficient documentation

## 2015-04-07 DIAGNOSIS — N760 Acute vaginitis: Secondary | ICD-10-CM

## 2015-04-07 DIAGNOSIS — Z3202 Encounter for pregnancy test, result negative: Secondary | ICD-10-CM | POA: Insufficient documentation

## 2015-04-07 DIAGNOSIS — N83201 Unspecified ovarian cyst, right side: Secondary | ICD-10-CM

## 2015-04-07 LAB — URINALYSIS COMPLETE WITH MICROSCOPIC (ARMC ONLY)
Bilirubin Urine: NEGATIVE
GLUCOSE, UA: NEGATIVE mg/dL
HGB URINE DIPSTICK: NEGATIVE
Ketones, ur: NEGATIVE mg/dL
Leukocytes, UA: NEGATIVE
NITRITE: NEGATIVE
Protein, ur: NEGATIVE mg/dL
Specific Gravity, Urine: 1.026 (ref 1.005–1.030)
pH: 8 (ref 5.0–8.0)

## 2015-04-07 LAB — WET PREP, GENITAL
CLUE CELLS WET PREP: NONE SEEN
TRICH WET PREP: NONE SEEN
YEAST WET PREP: NONE SEEN

## 2015-04-07 LAB — CHLAMYDIA/NGC RT PCR (ARMC ONLY)
CHLAMYDIA TR: NOT DETECTED
N GONORRHOEAE: NOT DETECTED

## 2015-04-07 LAB — POCT PREGNANCY, URINE: Preg Test, Ur: NEGATIVE

## 2015-04-07 MED ORDER — METRONIDAZOLE 500 MG PO TABS: 500.0000 mg | ORAL_TABLET | Freq: Two times a day (BID) | ORAL | Status: DC

## 2015-04-07 NOTE — ED Notes (Signed)
Pt reports pain to her lower abdomen bilaterally x 3 days. States she has had ovarian cysts in past (6 months ago) and she was put on birth control pills to help resolve. She has not been back to follow up on the cysts. Pt states that she has some pain when she urinates and that she has had some breakthrough bleeding in last 2 weeks. Pt alert & oriented with warm, dry skin an NAD.

## 2015-04-07 NOTE — ED Provider Notes (Signed)
[facility] Emergency Department Provider Note ____________________________________________  Time seen: Approximately 7:45 PM  I have reviewed the triage vital signs and the nursing notes.   HISTORY  Chief Complaint Pelvic Pain   HPI Deanna Harris is a 19 y.o. female who presents to the urgency department for pain and cramping in her lower abdomen with radiation to the mid back. She reports this has been going on for the past 5 days. To schedule an appointment with her OB/GYN at [facility] for July 8. She states that she is unable to wait that long and needs something for the pain today. She denies dysuria.  History reviewed. No pertinent past medical history.  There are no active problems to display for this patient.   History reviewed. No pertinent past surgical history.  Current Outpatient Rx  Name  Route  Sig  Dispense  Refill  . metroNIDAZOLE (FLAGYL) 500 MG tablet   Oral   Take 1 tablet (500 mg total) by mouth 2 (two) times daily.   14 tablet   0     Allergies Tramadol  No family history on file.  Social History History  Substance Use Topics  . Smoking status: Never Smoker   . Smokeless tobacco: Not on file  . Alcohol Use: No    Review of Systems Constitutional: No fever/chills Cardiovascular: Denies chest pain. Respiratory: Denies shortness of breath or cough. Gastrointestinal: Abdominal pain yes, diffuse lower abdomen., nausea yes, this morning, vomitingno. Genitourinary: Dysuria no, vaginal discharge no.. Musculoskeletal: Negative for back pain. Skin: Negative for rash. Neurological: Negative for headaches, focal weakness or numbness.  10-point ROS otherwise negative.  ____________________________________________   PHYSICAL EXAM:  VITAL SIGNS: ED Triage Vitals  Enc Vitals Group     BP 04/07/15 1754 113/60 mmHg     Pulse --      Resp 04/07/15 1754 20     Temp 04/07/15 1754 98.1 F (36.7 C)     Temp Source  04/07/15 1754 Oral     SpO2 04/07/15 1754 100 %     Weight 04/07/15 1754 130 lb (58.968 kg)     Height 04/07/15 1754  (1.6 m)     Head Cir --      Peak Flow --      Pain Score 04/07/15 1755 9     Pain Loc --      Pain Edu? --      Excl. in GC? --     Constitutional: Alert and oriented. Well appearing and in no acute distress. Eyes: Conjunctivae are normal. PERRL. EOMI. Head: Atraumatic. Nose: No congestion/rhinnorhea. Mouth/Throat: Mucous membranes are moist.  Oropharynx non-erythematous. Neck: No stridor. Cardiovascular: Good peripheral circulation. Respiratory: Normal respiratory effort.  No retractions. Gastrointestinal: Soft and nontender. No distention. No abdominal bruits. Genitourinary: No rebound tenderness to palpation. Pelvic exam: cervical os is erythematous and appears irritated. Cystic structure at 10 o'clock position on cervix approx. 3mm from cervical os--possible Nabothian cyst. Bilateral adnexal tenderness on exam. Specimens collected and sent to the lab. Musculoskeletal: No extremity tenderness nor edema.  Neurologic:  Normal speech and language. No gross focal neurologic deficits are appreciated. Speech is normal. No gait instability. Skin:  Skin is warm, dry and intact. No rash noted. Psychiatric: Mood and affect are normal. Speech and behavior are normal.  ____________________________________________   LABS (all labs ordered are listed, but only abnormal results are displayed)  Labs Reviewed  WET PREP, GENITAL - Abnormal; Notable for the following:  WBC, Wet Prep HPF POC FEW (*)    All other components within normal limits  URINALYSIS COMPLETEWITH MICROSCOPIC ([facility] ONLY) - Abnormal; Notable for the following:    Color, Urine YELLOW (*)    APPearance CLOUDY (*)    Bacteria, UA RARE (*)    Squamous Epithelial / LPF 0-5 (*)    All other components within normal limits  CHLAMYDIA/NGC RT PCR ([facility] ONLY)  POC URINE PREG, ED  POCT PREGNANCY,  URINE   ____________________________________________  RADIOLOGY  Small ovarian cyst on the right, otherwise negative ultrasound. ____________________________________________   PROCEDURES  Procedure(s) performed: Pelvic exam--see assessment.  ____________________________________________   INITIAL IMPRESSION / ASSESSMENT AND PLAN / ED COURSE  Pertinent labs & imaging results that were available during my care of the patient were reviewed by me and considered in my medical decision making (see chart for details).  Please follow up with the OB GYN. Call tomorrow for an appointment. ____________________________________________   FINAL CLINICAL IMPRESSION(S) / ED DIAGNOSES  Final diagnoses:  Adnexal pain  Vaginitis  Cyst of right ovary       Chinita PesterCari B Ayame Rena, FNP 04/07/15 [id number]  Loleta Roseory Forbach, MD 04/08/15 2108

## 2015-04-07 NOTE — ED Notes (Signed)
Pt discharged home after verbalizing understanding of discharge instructions; nad noted. 

## 2015-04-07 NOTE — ED Notes (Signed)
Pt reports pelvic pain radiating to back for the last 5 days

## 2015-10-13 ENCOUNTER — Encounter: Payer: Self-pay | Admitting: *Deleted

## 2015-10-13 ENCOUNTER — Emergency Department
Admission: EM | Admit: 2015-10-13 | Discharge: 2015-10-13 | Disposition: A | Discharge status: Home / Self Care | Payer: Medicaid Other | Attending: Emergency Medicine | Admitting: Emergency Medicine

## 2015-10-13 DIAGNOSIS — J069 Acute upper respiratory infection, unspecified: Secondary | ICD-10-CM | POA: Diagnosis not present

## 2015-10-13 DIAGNOSIS — J45909 Unspecified asthma, uncomplicated: Secondary | ICD-10-CM | POA: Insufficient documentation

## 2015-10-13 DIAGNOSIS — Z792 Long term (current) use of antibiotics: Secondary | ICD-10-CM | POA: Diagnosis not present

## 2015-10-13 DIAGNOSIS — R05 Cough: Secondary | ICD-10-CM | POA: Diagnosis present

## 2015-10-13 HISTORY — DX: Unspecified asthma, uncomplicated: J45.909

## 2015-10-13 MED ORDER — BENZONATATE 100 MG PO CAPS: ORAL_CAPSULE | ORAL | Status: DC

## 2015-10-13 NOTE — Discharge Instructions (Signed)
Upper Respiratory Infection, Adult Most upper respiratory infections (URIs) are caused by a virus. A URI affects the nose, throat, and upper air passages. The most common type of URI is often called "the common cold." HOME CARE   Take medicines only as told by your doctor.  Gargle warm saltwater or take cough drops to comfort your throat as told by your doctor.  Use a warm mist humidifier or inhale steam from a shower to increase air moisture. This may make it easier to breathe.  Drink enough fluid to keep your pee (urine) clear or pale yellow.  Eat soups and other clear broths.  Have a healthy diet.  Rest as needed.  Go back to work when your fever is gone or your doctor says it is okay.  You may need to stay home longer to avoid giving your URI to others.  You can also wear a face mask and wash your hands often to prevent spread of the virus.  Use your inhaler more if you have asthma.  Do not use any tobacco products, including cigarettes, chewing tobacco, or electronic cigarettes. If you need help quitting, ask your doctor. GET HELP IF:  You are getting worse, not better.  Your symptoms are not helped by medicine.  You have chills.  You are getting more short of breath.  You have brown or red mucus.  You have yellow or brown discharge from your nose.  You have pain in your face, especially when you bend forward.  You have a fever.  You have puffy (swollen) neck glands.  You have pain while swallowing.  You have white areas in the back of your throat. GET HELP RIGHT AWAY IF:   You have very bad or constant:  Headache.  Ear pain.  Pain in your forehead, behind your eyes, and over your cheekbones (sinus pain).  Chest pain.  You have long-lasting (chronic) lung disease and any of the following:  Wheezing.  Long-lasting cough.  Coughing up blood.  A change in your usual mucus.  You have a stiff neck.  You have changes in  your:  Vision.  Hearing.  Thinking.  Mood. MAKE SURE YOU:   Understand these instructions.  Will watch your condition.  Will get help right away if you are not doing well or get worse.   This information is not intended to replace advice given to you by your health care provider. Make sure you discuss any questions you have with your health care provider.   Document Released: 03/12/2008 Document Revised: 02/08/2015 Document Reviewed: 12/30/2013 Elsevier Interactive Patient Education 2016 [employer]   Boxholmessalon Perles as needed for cough. Increase fluids. Tylenol or ibuprofen as needed for aches or fever. Saline nose spray as needed for nasal congestion. [facility] if any continued problems.

## 2015-10-13 NOTE — ED Notes (Signed)
Cough and body aches for about 1 week unsure of fever   But positive chills.

## 2015-10-13 NOTE — ED Notes (Signed)
Pt complains of sore throat, cough and body aches for the last 2 days

## 2015-10-13 NOTE — ED Provider Notes (Signed)
[facility] Emergency Department Provider Note  ____________________________________________  Time seen: Approximately 10:04 AM  I have reviewed the triage vital signs and the nursing notes.   HISTORY  Chief Complaint  URI   HPI Deanna Harris is a 20 y.o. female is here complaining of cough and body aches for 1 week. Patient states that she also has sore throat from coughing. She is unaware of any fever but states she has chills at night. She denies being a smoker. She has been using over-the-counter medication without any improvement.   Past Medical History  Diagnosis Date  . Asthma     There are no active problems to display for this patient.   History reviewed. No pertinent past surgical history.  Current Outpatient Rx  Name  Route  Sig  Dispense  Refill  . benzonatate (TESSALON PERLES) 100 MG capsule      1-2 caps q 8 hours prn cough   40 capsule   0   . metroNIDAZOLE (FLAGYL) 500 MG tablet   Oral   Take 1 tablet (500 mg total) by mouth 2 (two) times daily.   14 tablet   0     Allergies Tramadol  No family history on file.  Social History Social History  Substance Use Topics  . Smoking status: Never Smoker   . Smokeless tobacco: None  . Alcohol Use: No    Review of Systems Constitutional: No fever/positive chills Eyes: No visual changes. ENT: Eyes negative sore throat. Cardiovascular: Denies chest pain. Respiratory: Denies shortness of breath. Positive cough Gastrointestinal: No abdominal pain.  No nausea, no vomiting.   Musculoskeletal: Negative for back pain. Skin: Negative for rash. Neurological: Negative for headaches, focal weakness or numbness.  10-point ROS otherwise negative.  ____________________________________________   PHYSICAL EXAM:  VITAL SIGNS: ED Triage Vitals  Enc Vitals Group     BP 10/13/15 0931 110/65 mmHg     Pulse Rate 10/13/15 0931 76     Resp 10/13/15 0931 16     Temp 10/13/15  0931 98.4 F (36.9 C)     Temp Source 10/13/15 0931 Oral     SpO2 10/13/15 0931 98 %     Weight 10/13/15 0931 137 lb (62.143 kg)     Height 10/13/15 0931 5\' 3"  (1.6 m)     Head Cir --      Peak Flow --      Pain Score --      Pain Loc --      Pain Edu? --      Excl. in GC? --     Constitutional: Alert and oriented. Well appearing and in no acute distress. Eyes: Conjunctivae are normal. PERRL. EOMI. Head: Atraumatic. Nose: No congestion/rhinnorhea. Mouth: Moderate posterior drainage but no erythema or exudate was seen. Neck: No stridor.   Hematological/Lymphatic/Immunilogical: No cervical lymphadenopathy. Cardiovascular: Normal rate, regular rhythm. Grossly normal heart sounds.  Good peripheral circulation. Respiratory: Normal respiratory effort.  No retractions. Lungs CTAB. Gastrointestinal: Soft and nontender. No distention.  Musculoskeletal: No lower extremity tenderness nor edema.  No joint effusions. Neurologic:  Normal speech and language. No gross focal neurologic deficits are appreciated. No gait instability. Skin:  Skin is warm, dry and intact. No rash noted. Psychiatric: Mood and affect are normal. Speech and behavior are normal.  ____________________________________________   LABS (all labs ordered are listed, but only abnormal results are displayed)  Labs Reviewed - No data to display PROCEDURES  Procedure(s) performed: None  Critical  Care performed: No  ____________________________________________   INITIAL IMPRESSION / ASSESSMENT AND PLAN / ED COURSE  Pertinent labs & imaging results that were available during my care of the patient were reviewed by me and considered in my medical decision making (see chart for details).  Patient is given prescription for Tessalon Perles 1 or 2 every 8 hours as needed for cough. Patient is encouraged to drink fluids and also take ibuprofen or Tylenol if needed. ____________________________________________   FINAL  CLINICAL IMPRESSION(S) / ED DIAGNOSES  Final diagnoses:  Acute upper respiratory infection      Tommi Rumps, PA-C 10/13/15 1552  Governor Rooks, MD 10/14/15 2242

## 2017-02-02 ENCOUNTER — Encounter: Payer: Self-pay | Admitting: Emergency Medicine

## 2017-02-02 ENCOUNTER — Emergency Department
Admission: EM | Admit: 2017-02-02 | Discharge: 2017-02-02 | Disposition: A | Discharge status: Home / Self Care | Payer: Worker's Compensation | Attending: Emergency Medicine | Admitting: Emergency Medicine

## 2017-02-02 DIAGNOSIS — Y929 Unspecified place or not applicable: Secondary | ICD-10-CM | POA: Insufficient documentation

## 2017-02-02 DIAGNOSIS — Y999 Unspecified external cause status: Secondary | ICD-10-CM | POA: Diagnosis not present

## 2017-02-02 DIAGNOSIS — W208XXA Other cause of strike by thrown, projected or falling object, initial encounter: Secondary | ICD-10-CM | POA: Diagnosis not present

## 2017-02-02 DIAGNOSIS — S0990XA Unspecified injury of head, initial encounter: Secondary | ICD-10-CM | POA: Diagnosis not present

## 2017-02-02 DIAGNOSIS — Y939 Activity, unspecified: Secondary | ICD-10-CM | POA: Insufficient documentation

## 2017-02-02 DIAGNOSIS — J45909 Unspecified asthma, uncomplicated: Secondary | ICD-10-CM | POA: Insufficient documentation

## 2017-02-02 MED ORDER — DIPHENHYDRAMINE HCL 50 MG/ML IJ SOLN
50.0000 mg | Freq: Once | INTRAMUSCULAR | Status: AC
  Administered 2017-02-02: 50 mg via INTRAMUSCULAR
  Filled 2017-02-02: qty 1

## 2017-02-02 MED ORDER — KETOROLAC TROMETHAMINE 30 MG/ML IJ SOLN
30.0000 mg | Freq: Once | INTRAMUSCULAR | Status: AC
  Administered 2017-02-02: 30 mg via INTRAMUSCULAR
  Filled 2017-02-02: qty 1

## 2017-02-02 MED ORDER — IBUPROFEN 800 MG PO TABS: 800.0000 mg | ORAL_TABLET | Freq: Three times a day (TID) | ORAL | 0 refills | Status: DC | PRN

## 2017-02-02 MED ORDER — PROMETHAZINE HCL 25 MG/ML IJ SOLN
25.0000 mg | Freq: Once | INTRAMUSCULAR | Status: AC
  Administered 2017-02-02: 25 mg via INTRAMUSCULAR
  Filled 2017-02-02: qty 1

## 2017-02-02 NOTE — ED Provider Notes (Signed)
[facility] Emergency Department Provider Note  ____________________________________________  Time seen: Approximately 8:19 PM  I have reviewed the triage vital signs and the nursing notes.   HISTORY  Chief Complaint Head Injury    HPI Deanna Harris is a 21 y.o. female who presents emergency department complaining of a headache status post head injury. Patient reports that she was at work when some boxes weighing 5-7 pounds fell on her right side of the head. Patient denies any loss of consciousness. She reports a headache and right-sided neck pain at this time. She denies any visual changes, radicular symptoms, chest pain, shortness of breath, nausea, vomiting. No medications prior to arrival. No other complaints.   Past Medical History:  Diagnosis Date  . Asthma     There are no active problems to display for this patient.   History reviewed. No pertinent surgical history.  Prior to Admission medications   Medication Sig Start Date End Date Taking? Authorizing Provider  benzonatate (TESSALON PERLES) 100 MG capsule 1-2 caps q 8 hours prn cough 10/13/15   Tommi Rumps, PA-C  ibuprofen (ADVIL,MOTRIN) 800 MG tablet Take 1 tablet (800 mg total) by mouth every 8 (eight) hours as needed. 02/02/17   Delorise Royals Oley Lahaie, PA-C  metroNIDAZOLE (FLAGYL) 500 MG tablet Take 1 tablet (500 mg total) by mouth 2 (two) times daily. 04/07/15   Chinita Pester, FNP    Allergies Tramadol  History reviewed. No pertinent family history.  Social History Social History  Substance Use Topics  . Smoking status: Never Smoker  . Smokeless tobacco: Never Used  . Alcohol use No     Review of Systems  Constitutional: No fever/chills Eyes: No visual changes.  Cardiovascular: no chest pain. Respiratory: no cough. No SOB. Gastrointestinal: No abdominal pain.  No nausea, no vomiting.   Musculoskeletal: Negative for musculoskeletal pain. Skin: Negative for rash,  abrasions, lacerations, ecchymosis. Neurological: Positive for headache but denies focal weakness or numbness. 10-point ROS otherwise negative.  ____________________________________________   PHYSICAL EXAM:  VITAL SIGNS: ED Triage Vitals  Enc Vitals Group     BP 02/02/17 1913 131/76     Pulse Rate 02/02/17 1913 76     Resp 02/02/17 1913 16     Temp 02/02/17 1913 98.7 F (37.1 C)     Temp Source 02/02/17 1913 Oral     SpO2 02/02/17 1913 100 %     Weight 02/02/17 1914 138 lb (62.6 kg)     Height 02/02/17 1914  (1.6 m)     Head Circumference --      Peak Flow --      Pain Score 02/02/17 1913 9     Pain Loc --      Pain Edu? --      Excl. in GC? --      Constitutional: Alert and oriented. Well appearing and in no acute distress. Eyes: Conjunctivae are normal. PERRL. EOMI. Head: Atraumatic.No visible signs of trauma to include ecchymosis, abrasion, laceration. Patient is mildly tender to palpation in the right parietal region. No palpable abnormality. No crepitus. No battle signs. No raccoon eyes. No serosanguineous fluid drainage from the ears or nares. ENT:      Ears:       Nose: No congestion/rhinnorhea.      Mouth/Throat: Mucous membranes are moist.  Neck: No stridor.  No cervical spine tenderness to palpation.  Cardiovascular: Normal rate, regular rhythm. Normal S1 and S2.  Good peripheral circulation. Respiratory:  Normal respiratory effort without tachypnea or retractions. Lungs CTAB. Good air entry to the bases with no decreased or absent breath sounds. Musculoskeletal: Full range of motion to all extremities. No gross deformities appreciated. Neurologic:  Normal speech and language. No gross focal neurologic deficits are appreciated. Cranial nerves II through XII grossly intact. Negative pronator drift. Negative Romberg's. Skin:  Skin is warm, dry and intact. No rash noted. Psychiatric: Mood and affect are normal. Speech and behavior are normal. Patient exhibits  appropriate insight and judgement.   ____________________________________________   LABS (all labs ordered are listed, but only abnormal results are displayed)  Labs Reviewed - No data to display ____________________________________________  EKG   ____________________________________________  RADIOLOGY   No results found.  ____________________________________________    PROCEDURES  Procedure(s) performed:    Procedures    [place] CT Head Rule   CT head is recommended if yes to ANY of the following:   Major Criteria ("high risk" for an injury requiring neurosurgical intervention, sensitivity 100%):   No.   GCS < 15 at 2 hours post-injury No.   Suspected open or depressed skull fracture No.   Any sign of basilar skull fracture? (Hemotympanum, racoon eyes, battle's sign, CSF oto/rhinorrhea) No.   ? 2 episodes of vomiting No.   Age ? 65   Minor Criteria ("medium" risk for an intracranial traumatic finding, sensitivity 83-100%):   No.   Retrograde Amnesia to the Event ? 30 minutes No.   "Dangerous" Mechanism? (Pedestrian struck by motor vehicle, occupant ejected from motor vehicle, fall from >3 ft or >5 stairs.)   Based on my evaluation of the patient, including application of this decision instrument, CT head to evaluate for traumatic intracranial injury is not indicated at this time. I have discussed this recommendation with the patient who states understanding and agreement with this plan.    Medications  ketorolac (TORADOL) 30 MG/ML injection 30 mg (not administered)  promethazine (PHENERGAN) injection 25 mg (not administered)  diphenhydrAMINE (BENADRYL) injection 50 mg (not administered)     ____________________________________________   INITIAL IMPRESSION / ASSESSMENT AND PLAN / ED COURSE  Pertinent labs & imaging results that were available during my care of the patient were reviewed by me and considered in my medical decision making (see chart for  details).  Review of the [place] CSRS was performed in accordance of the [facility] prior to dispensing any controlled drugs.     Patient's diagnosis is consistent with minor head injury. No indication for head CT at this time. Patient is given migraine cocktail for symptom control.. Patient will be discharged home with prescriptions for ibuprofen for any residual headache. Patient is to follow up with primary care as needed or otherwise directed. Patient is given ED precautions to return to the ED for any worsening or new symptoms.     ____________________________________________  FINAL CLINICAL IMPRESSION(S) / ED DIAGNOSES  Final diagnoses:  Minor head injury, initial encounter      NEW MEDICATIONS STARTED DURING THIS VISIT:  New Prescriptions   IBUPROFEN (ADVIL,MOTRIN) 800 MG TABLET    Take 1 tablet (800 mg total) by mouth every 8 (eight) hours as needed.        This chart was dictated using voice recognition software/Dragon. Despite best efforts to proofread, errors can occur which can change the meaning. Any change was purely unintentional.    Racheal Patches, PA-C 02/02/17 2036    Sharman Cheek, MD 02/04/17 [phone]

## 2017-02-02 NOTE — ED Notes (Signed)
Pt. Picked up by manager from work.

## 2017-02-02 NOTE — ED Notes (Signed)
Pt. States while at work tonight, boxes fell on the right side of patients head.  Pt. In room eating in no apparent distress.  No signs of injury noted.  Pt. Denies LOC.

## 2017-02-02 NOTE — ED Triage Notes (Signed)
Pt ambulatory to triage in NAD, report head injury at work, boxes fell on the right side of her head, c/o right sided head pain and right upper neck pain.  Denies LOC.  Denies nausea.  Pt's manager present Dr[employer], according to [employer] profile UDS upon request, per manager not requiring at this time.

## 2017-03-04 ENCOUNTER — Other Ambulatory Visit: Payer: Self-pay | Admitting: Certified Nurse Midwife

## 2017-03-05 ENCOUNTER — Other Ambulatory Visit: Payer: Self-pay | Admitting: Certified Nurse Midwife

## 2017-03-05 ENCOUNTER — Telehealth: Payer: Self-pay | Admitting: Certified Nurse Midwife

## 2017-03-05 NOTE — Telephone Encounter (Signed)
Refill of Seasonique generic approved. Needs annual exam.

## 2017-03-05 NOTE — Telephone Encounter (Signed)
Pt aware rx sent in. 

## 2017-03-05 NOTE — Telephone Encounter (Signed)
Pt aware and appt scheduled.  

## 2017-03-05 NOTE — Telephone Encounter (Signed)
Pt is schedule for 04/02/17 for annual is requesting an refill for birth control to [facility] on [address]. Pt would like an call back . Please

## 2017-03-05 NOTE — Telephone Encounter (Signed)
Refill on BCPs approved, but needs apt scheduled for annual exam. Please call her and let her know.

## 2017-03-07 MED ORDER — LEVONORGEST-ETH ESTRAD 91-DAY 0.15-0.03 &0.01 MG PO TABS: 1.0000 | ORAL_TABLET | Freq: Every day | ORAL | 0 refills | Status: DC

## 2017-04-03 ENCOUNTER — Encounter: Payer: Self-pay | Admitting: Certified Nurse Midwife

## 2017-04-03 ENCOUNTER — Ambulatory Visit (INDEPENDENT_AMBULATORY_CARE_PROVIDER_SITE_OTHER): Payer: Medicaid Other | Admitting: Certified Nurse Midwife

## 2017-04-03 VITALS — BP 110/60 | HR 86 | Ht 63.0 in | Wt 150.0 lb

## 2017-04-03 DIAGNOSIS — Z01419 Encounter for gynecological examination (general) (routine) without abnormal findings: Secondary | ICD-10-CM

## 2017-04-03 DIAGNOSIS — Z0000 Encounter for general adult medical examination without abnormal findings: Secondary | ICD-10-CM | POA: Diagnosis not present

## 2017-04-03 DIAGNOSIS — R87610 Atypical squamous cells of undetermined significance on cytologic smear of cervix (ASC-US): Secondary | ICD-10-CM | POA: Diagnosis not present

## 2017-04-03 DIAGNOSIS — R87810 Cervical high risk human papillomavirus (HPV) DNA test positive: Secondary | ICD-10-CM | POA: Diagnosis not present

## 2017-04-03 DIAGNOSIS — Z113 Encounter for screening for infections with a predominantly sexual mode of transmission: Secondary | ICD-10-CM | POA: Diagnosis not present

## 2017-04-03 DIAGNOSIS — Z124 Encounter for screening for malignant neoplasm of cervix: Secondary | ICD-10-CM

## 2017-04-03 DIAGNOSIS — Z3202 Encounter for pregnancy test, result negative: Secondary | ICD-10-CM | POA: Diagnosis not present

## 2017-04-03 DIAGNOSIS — R1032 Left lower quadrant pain: Secondary | ICD-10-CM | POA: Diagnosis not present

## 2017-04-03 DIAGNOSIS — G43119 Migraine with aura, intractable, without status migrainosus: Secondary | ICD-10-CM

## 2017-04-03 DIAGNOSIS — G43009 Migraine without aura, not intractable, without status migrainosus: Secondary | ICD-10-CM

## 2017-04-03 LAB — POCT URINALYSIS DIPSTICK
Bilirubin, UA: NEGATIVE
Glucose, UA: NEGATIVE
Ketones, UA: NEGATIVE
Leukocytes, UA: NEGATIVE
Nitrite, UA: NEGATIVE
PH UA: 5 (ref 5.0–8.0)
RBC UA: NEGATIVE
UROBILINOGEN UA: 0.2 U/dL

## 2017-04-03 LAB — POCT URINE PREGNANCY: PREG TEST UR: NEGATIVE

## 2017-04-03 MED ORDER — NORETHINDRONE 0.35 MG PO TABS: 1.0000 | ORAL_TABLET | Freq: Every day | ORAL | 11 refills | Status: DC

## 2017-04-03 MED ORDER — IBUPROFEN 800 MG PO TABS: 800.0000 mg | ORAL_TABLET | Freq: Three times a day (TID) | ORAL | 3 refills | Status: DC | PRN

## 2017-04-03 NOTE — Progress Notes (Signed)
Gynecology Annual Exam  PCP: Patient, No Pcp Per  Chief Complaint:  Chief Complaint  Patient presents with  . Gynecologic Exam    History of Present Illness:Deanna Harris is a 21 year old Caucasian/White female , G 3 P 2 0 1 2 , who presents for her annual exam . She is having problems which include worsening headaches on her BCPS Solmon Ice(Seasonique). She was placed on Seasonique last year after having break through bleeding with Quartet. Had been seen in the ER for RLQ pain and a Right ovarian cyst.  Her headaches are frontal and parietal. Her headaches are accompanied by nausea. She has seen some spots in her vision. Recently had a headache accompanied by scotomata and paresthesia of the left side of her face and body-"it felt numb". She reports also having an injury to her head 02/02/17 when several 5-7 pound boxes fell on her right side of her head and neck.  She did not have LOC from this injury. She has had a recent eye exam and just started wearing glasses in this last week. She will take ibuprofen with +/- relief. Never had an evaluation of her migraines. She also complains of intermittent left lower abdominal pain over the last month. Yesterday the pain became constant and radiated to her left flank.  She noticed some blood on the toilet tissue after wiping...was not sure if it was from urine or vagina.Denies dysuria, urinary frequency, or feeling like she is having incomplete emptying. No diarrhea associated with the pain. No fever.Last BM yesterday was normal. No blood in stool. Ibuprofen 800 mgm relieves the pain. She had some Tylenol #3 left over from her wisdom teeth extraction in March 2018 and took this for the pain, which helped her sleep. Her menses are regular on her Seasonique, every 3 months and her LMP was 03/07/2017. They last 5 days with 3-4 heavier days requiring a tampon change 6-7 x/24 hours. She wears a pad also with the tampon, but does not need to change her pad as  frequently. She has dysmenorrhea the first couple days of her menses in lower abdomen.  The patient's past medical history is notable for eczema and mild asthma.   She is sexually active. She has been using birth control pills for contraception since the birth of her last child in 2014, Same partner for the last 6-7 years. Last Pap smear 01/23/16, results: ASCUS with positive HRHPV Mammogram is not applicable.  There is no family history of breast cancer.  There is no family history of ovarian cancer.  The patient does do self breast exams.  The patient does not smoke.  The patient does not drink alcohol.  The patient does not use illegal drugs.  The patient [facility]. The patient does get adequate calcium in her diet.  She has not had a recent cholesterol screen   The patient denies current symptoms of depression.    Review of Systems: Review of Systems  Constitutional: Negative for chills, fever and weight loss.  HENT: Negative for congestion, sinus pain and sore throat.   Eyes: Positive for blurred vision. Negative for pain.       Black spots in vision with headaches  Respiratory: Positive for shortness of breath. Negative for hemoptysis and wheezing.   Cardiovascular: Negative for chest pain, palpitations and leg swelling.  Gastrointestinal: Positive for abdominal pain (LLQ). Negative for blood in stool, diarrhea, heartburn, nausea and vomiting.  Genitourinary:  Negative for dysuria, frequency and urgency.       ? hematuria  Musculoskeletal: Negative for back pain, joint pain and myalgias.  Skin: Negative for itching and rash.  Neurological: Positive for sensory change and headaches. Negative for dizziness, tingling and loss of consciousness.  Endo/Heme/Allergies: Negative for environmental allergies and polydipsia. Does not bruise/bleed easily.       Negative for hirsutism   Psychiatric/Behavioral: Negative for depression. The patient is not  nervous/anxious and does not have insomnia.     Past Medical History:  Past Medical History:  Diagnosis Date  . Asthma   . Migraine headache with aura   . Ovarian cyst 09/2013   left side  . Postpartum depression     Past Surgical History:  Past Surgical History:  Procedure Laterality Date  . CHOLECYSTECTOMY  04/24/2011  . DILATION AND CURETTAGE OF UTERUS     for RPOC after elective AB    Family History:  Family History  Problem Relation Age of Onset  . Diabetes Mother        ?CHTN  . Lung cancer Maternal Grandmother   . Pancreatic cancer Other   . Heart attack Maternal Grandfather     Social History:  Social History   Social History  . Marital status: Single    Spouse name: N/A  . Number of children: 2  . Years of education: N/A   Occupational History  . [employer]    Social History Main Topics  . Smoking status: Never Smoker  . Smokeless tobacco: Never Used  . Alcohol use No  . Drug use: No  . Sexual activity: Yes    Partners: Male    Birth control/ protection: Pill   Other Topics Concern  . Not on file   Social History Narrative  . No narrative on file    Allergies:  Allergies  Allergen Reactions  . Tramadol Hives    Medications: Prior to Admission medications   Medication Sig Start Date End Date Taking? Authorizing Provider         ibuprofen (ADVIL,MOTRIN) 800 MG tablet Take 1 tablet (800 mg total) by mouth every 8 (eight) hours as needed. 02/02/17   Cuthriell, Delorise Royals, PA-C  Levonorgestrel-Ethinyl Estradiol (ASHLYNA) 0.15-0.03 &0.01 MG tablet Take 1 tablet by mouth daily. 03/07/17   Farrel Conners, CNM   Physical Exam Vitals: BP 110/60   Pulse 86   Ht 5\' 3"  (1.6 m)   Wt 150 lb (68 kg)   LMP 03/03/2017 (Approximate)   BMI 26.57 kg/m   General: WF in NAD HEENT: normocephalic, anicteric Neck: no thyroid enlargement, no palpable nodules, no cervical lymphadenopathy  Pulmonary: No increased work of breathing,  CTAB Cardiovascular: RRR, without murmur  Breast: Breast symmetrical, no tenderness, breasts are very granular bilaterally without  dominant mass, no skin or nipple retraction present, no nipple discharge.  No axillary, infraclavicular or supraclavicular lymphadenopathy. Abdomen: Soft, non-distended, tenderness in LLQ. No rebound.   Umbilicus without lesions.  No hepatomegaly or masses palpable. No evidence of hernia. Genitourinary:  External: Normal external female genitalia.  Normal urethral meatus, normal Bartholin's and Skene's glands.    Vagina: Normal vaginal mucosa, no evidence of prolapse., small amount mucoepithelial discharge   Cervix: Grossly normal in appearance, parous, friable endocervix, non-tender  Uterus: Retroflexed, normal size, shape, and consistency, immobile, and non-tender  Adnexa: No masses palpable bilaterally, tenderness on left. NT on right  Rectal: deferred  Lymphatic: no evidence of inguinal lymphadenopathy Extremities: no edema,  erythema, or tenderness Neurologic: Grossly intact Psychiatric: mood appropriate, affect full     Assessment: 21 y.o. G3 P2012  annual gyn exam ASCUS PAP with positive HRHPV LLQ pain Intractable Migraine headache with aura and Atypical migraines   Plan:   1) Breast cancer screening - recommend monthly self breast exam.   2) STI screening done  3) Cervical cancer screening/ ASCUS PAP with positive HRHPV - Paptima done.   4) Contraception - advised needs to stop estrogen containing birth control due to classical migraines with visual changes and paresthesias due to incrased risk of stroke. Educated on available contraception : IUDs/ POP/ Nexplanon/ condoms. Desires to start POP while considering options.  5) LLQ pain-pelvic ultrasound ordered and will follow up after ultrasound. Urine culture  6) Neurology consult for evaluation and treatment of headaches.  Farrel Conners, CNM

## 2017-04-04 ENCOUNTER — Ambulatory Visit (INDEPENDENT_AMBULATORY_CARE_PROVIDER_SITE_OTHER): Payer: Medicaid Other | Admitting: Certified Nurse Midwife

## 2017-04-04 ENCOUNTER — Ambulatory Visit (INDEPENDENT_AMBULATORY_CARE_PROVIDER_SITE_OTHER): Payer: Medicaid Other

## 2017-04-04 ENCOUNTER — Other Ambulatory Visit: Payer: Self-pay | Admitting: Certified Nurse Midwife

## 2017-04-04 ENCOUNTER — Encounter: Payer: Self-pay | Admitting: Certified Nurse Midwife

## 2017-04-04 VITALS — BP 100/60 | HR 70 | Ht 63.0 in | Wt 150.0 lb

## 2017-04-04 DIAGNOSIS — R1032 Left lower quadrant pain: Secondary | ICD-10-CM

## 2017-04-04 DIAGNOSIS — G43519 Persistent migraine aura without cerebral infarction, intractable, without status migrainosus: Secondary | ICD-10-CM

## 2017-04-04 DIAGNOSIS — N83201 Unspecified ovarian cyst, right side: Secondary | ICD-10-CM | POA: Diagnosis not present

## 2017-04-04 NOTE — Progress Notes (Signed)
  HPI: 21 year old G3 P2012 WF presented for her annual exam yesterday with complaints of intermittent left lower abdominal pain over the last month. Two days ago the pain became constant and radiated to her left flank.  She noticed some blood on the toilet tissue after wiping...was not sure if it was from urine or vagina.Denies dysuria, urinary frequency, or feeling like she is having incomplete emptying. No diarrhea associated with the pain. No fever.Last BM 6/26 was normal. No blood in stool. Ibuprofen 800 mgm relieves the pain. She had some Tylenol #3 which relieved the pain and helped her sleep.She returns today following a pelvic ultrasound  Ultrasound demonstrates a simple cyst on her right ovary measuring 4 x 2.9 x 3.6 cm. No masses seen on the left. No FF or uterine masses Patient is being switched to POP instead of combined oral contraceptive pills due to intractable migraines with aura and atypical migraines. She is having some spotting today.  PMHx: She  has a past medical history of Asthma; Ovarian cyst (09/2013); and Postpartum depression. Also,  has a past surgical history that includes Cholecystectomy (04/24/2011) and Dilation and curettage of uterus., family history includes Diabetes in her mother; Lung cancer in her maternal grandmother; Pancreatic cancer in her other.,  reports that she has never smoked. She has never used smokeless tobacco. She reports that she does not drink alcohol or use drugs.  She has a current medication list which includes the following prescription(s): ibuprofen and norethindrone. Also, is allergic to tramadol.  ROS  Objective: BP 100/60   Pulse 70   Ht 5\' 3"  (1.6 m)   Wt 150 lb (68 kg)   LMP 03/03/2017 (Exact Date)   BMI 26.57 kg/m   Physical examination Constitutional NAD, Conversant, smiling, playing with her child  Skin No rashes, lesions or ulceration.   Extremities: Moves all appropriately.  Normal ROM for age.   Neuro: Grossly intact    Psych: Oriented to PPT.  Normal mood. Normal affect.   Assessment:  LLQ pain unknown etiology Doubt that simple right ovarian cyst is cause of her LLQ pain Intractable migraine headaches  Plan: Continue norethindrone or minipill Ibuprofen for pain Encourage further evaluation for persistent or worsening pain in urgent care or ER. Recommend getting PCP [facility] in 2-3 months Neurological consult pending  Farrel Connersolleen Joane Postel, CNM

## 2017-04-06 LAB — URINE CULTURE: Organism ID, Bacteria: NO GROWTH

## 2017-04-07 ENCOUNTER — Encounter: Payer: Self-pay | Admitting: Certified Nurse Midwife

## 2017-04-08 ENCOUNTER — Telehealth: Payer: Self-pay | Admitting: Certified Nurse Midwife

## 2017-04-08 MED ORDER — MEDROXYPROGESTERONE ACETATE 10 MG PO TABS: ORAL_TABLET | ORAL | 0 refills | Status: DC

## 2017-04-08 NOTE — Telephone Encounter (Signed)
Patient had called the office regarding her bleeding and cramping. She was taken off her combination pill due to to migraine headaches with aura and hemianopia at times and started on a progestin only pill. Had a ultrasound for LLQ pain which revealed a right 4cm simple cyst. Decided to stop the POP and her bleeding has been heavy since yesterday with increased cramping in her lower abdomen sometimes going thru a pad "every 10 min". Ibuprofen not helping cramping. Wanted to see doctor because she feels like the cyst is causing her more pain. Advised to go to ER for continued heavy bleeding and severe pain. Don't think the simple cyst is causing this problem. Can get her an appointment to [facility] and in the meantime start her on a provera 10 mgm to try to stop the bleeding.

## 2017-04-09 ENCOUNTER — Encounter: Payer: Self-pay | Admitting: Certified Nurse Midwife

## 2017-04-09 ENCOUNTER — Ambulatory Visit (INDEPENDENT_AMBULATORY_CARE_PROVIDER_SITE_OTHER): Payer: Medicaid Other | Admitting: Obstetrics and Gynecology

## 2017-04-09 ENCOUNTER — Encounter: Payer: Self-pay | Admitting: Obstetrics and Gynecology

## 2017-04-09 VITALS — BP 118/74 | Ht 63.0 in | Wt 151.0 lb

## 2017-04-09 DIAGNOSIS — R87610 Atypical squamous cells of undetermined significance on cytologic smear of cervix (ASC-US): Secondary | ICD-10-CM

## 2017-04-09 DIAGNOSIS — R87810 Cervical high risk human papillomavirus (HPV) DNA test positive: Secondary | ICD-10-CM

## 2017-04-09 DIAGNOSIS — R102 Pelvic and perineal pain: Secondary | ICD-10-CM

## 2017-04-09 DIAGNOSIS — G8929 Other chronic pain: Secondary | ICD-10-CM | POA: Insufficient documentation

## 2017-04-09 HISTORY — DX: Cervical high risk human papillomavirus (HPV) DNA test positive: R87.810

## 2017-04-09 HISTORY — DX: Atypical squamous cells of undetermined significance on cytologic smear of cervix (ASC-US): R87.610

## 2017-04-09 MED ORDER — ACETAMINOPHEN-CODEINE #3 300-30 MG PO TABS: 1.0000 | ORAL_TABLET | Freq: Every day | ORAL | 0 refills | Status: DC | PRN

## 2017-04-09 NOTE — Progress Notes (Signed)
Obstetrics & Gynecology Office Visit   Chief Complaint  Patient presents with  . Pelvic Pain  . Menstrual Problem   History of Present Illness: Patient is a 21 y.o. [id number] who LMP was 6/30-7/1 and is still ongoing.  She presents in follow up from a visit from last week for pelvic pain, which the patient states has been present for about 4 years now.  She has a history of ovarian cysts.  She had been on continuous combined OCPs for several years, which have controlled her pain. However, due to what the patient called "strokes," her medication was switched to norethindrone 0.35mg .  She states that she never took this medication because she was afraid of the affects of mixing this medication with her history of having migraine headaches.   She had a pelvic ultrasound on 6/28, which was normal apart from her uterus being retroflexed.  Previous clinic notes indicate that the ultrasound showed a 4cm cyst, but I do not see this on the ultrasound on my review of it. Her pain is worse with menstruation and usually is either absent or tolerable while not menstruating. Nothing else seems to make the pain worse. She has taken ibuprofen, aleve, midol, and tylenol for her pain without relief.   The pain level is 10/10 at worst.  She notes no associated symptoms.  The pain is located across her entire lower abdomen and does not radiate.    Past Medical History:  Diagnosis Date  . Asthma   . Migraine headache with aura   . Ovarian cyst 09/2013   left side  . Postpartum depression     Past Surgical History:  Procedure Laterality Date  . CHOLECYSTECTOMY  04/24/2011  . DILATION AND CURETTAGE OF UTERUS     for RPOC after elective AB    Gynecologic History: Patient's last menstrual period was 03/03/2017.  Obstetric History: [id number]  Family History  Problem Relation Age of Onset  . Diabetes Mother        ?CHTN  . Lung cancer Maternal Grandmother   . Pancreatic cancer Other   . Heart attack  Maternal Grandfather     Social History   Social History  . Marital status: Single    Spouse name: N/A  . Number of children: 2  . Years of education: N/A   Occupational History  . [employer]    Social History Main Topics  . Smoking status: Never Smoker  . Smokeless tobacco: Never Used  . Alcohol use No  . Drug use: No  . Sexual activity: Yes    Partners: Male    Birth control/ protection: Pill   Other Topics Concern  . Not on file   Social History Narrative  . No narrative on file    Allergies  Allergen Reactions  . Tramadol Hives    Prior to Admission medications   Medication Sig Start Date End Date Taking? Authorizing Provider  ibuprofen (ADVIL,MOTRIN) 800 MG tablet Take 1 tablet (800 mg total) by mouth every 8 (eight) hours as needed for headache, mild pain, moderate pain or cramping. 04/03/17  Yes Farrel Conners, CNM  medroxyPROGESTERone (PROVERA) 10 MG tablet Take one tab daily x 2 weeks 04/08/17  Yes Farrel Conners, CNM  norethindrone (MICRONOR,CAMILA,ERRIN) 0.35 MG tablet Take 1 tablet (0.35 mg total) by mouth daily. Patient not taking: Reported on 04/09/2017 04/03/17   Farrel Conners, CNM    Review of Systems  Constitutional: Negative.   HENT: Negative.   Eyes: Negative.  Respiratory: Negative.   Cardiovascular: Negative.   Gastrointestinal: Positive for abdominal pain (see HPI). Negative for blood in stool, constipation, diarrhea, melena, nausea and vomiting.  Genitourinary: Negative for dysuria, flank pain, frequency, hematuria and urgency.  Musculoskeletal: Negative.   Skin: Negative.   Neurological: Negative.   Psychiatric/Behavioral: Negative.      Physical Exam BP 118/74   Ht 5\' 3"  (1.6 m)   Wt 151 lb (68.5 kg)   LMP 03/03/2017   BMI 26.75 kg/m  Patient's last menstrual period was 03/03/2017. Physical Exam  Constitutional: She is oriented to person, place, and time. She appears well-developed and well-nourished. No distress.    Genitourinary: Vagina normal. Pelvic exam was performed with patient supine. There is no rash, tenderness, lesion or injury on the right labia. There is no tenderness, lesion or injury on the left labia. No erythema, tenderness or bleeding in the vagina. No signs of injury around the vagina.  Right adnexum displays tenderness. Right adnexum does not display mass, does not display fullness and present.  Left adnexum displays tenderness. Left adnexum does not display mass, does not display fullness and present. Cervix does not exhibit motion tenderness, lesion, discharge or polyp.   Uterus is tender and retroverted. Uterus is not enlarged, exhibiting a mass or mobile.  HENT:  Head: Normocephalic and atraumatic.  Eyes: EOM are normal. No scleral icterus.  Neck: Normal range of motion. Neck supple.  Cardiovascular: Normal rate and regular rhythm.  Exam reveals no gallop and no friction rub.   No murmur heard. Pulmonary/Chest: Effort normal and breath sounds normal. No respiratory distress. She has no wheezes. She has no rales.  Abdominal: Soft. Bowel sounds are normal. She exhibits no distension and no mass. There is tenderness (mild ttp across entire lower abdomen). There is no rebound and no guarding.  Musculoskeletal: Normal range of motion. She exhibits no edema.  Neurological: She is alert and oriented to person, place, and time. No cranial nerve deficit.  Skin: Skin is warm and dry. No erythema.  Psychiatric: She has a normal mood and affect. Her behavior is normal. Judgment normal.   Female chaperone present for pelvic and breast  portions of the physical exam  Assessment: 21 y.o. G25P2012 female with chronic pelvic pain  Plan: Problem List Items Addressed This Visit    Chronic pelvic pain in female - Primary (Chronic)   Relevant Medications   acetaminophen-codeine (TYLENOL #3) 300-30 MG tablet    Discussed that I did not find a cyst on her ultrasound. She has a pap smear (last year  was abnormal) pending along with urine culture, gonorrhea, and chlamydia. She was having bleeding today. So, I did not perform a wet prep.  Discussed the multiple etiologies of pelvic pain, including urologic, gynecologic, GI, MSK, and psychiatric.  Her symptoms appear to be consistent with endometriosis.  She is hesitatant to take medication with hormones given her migraine headache history. She has an appointment on 7/11 with her neurologist and would like to check with this person before starting hormonal medication.   Given that her pain is mostly present during her menses, it makes sense to have her on a progesterone-only OCP.  Consider trying norethindrone 5mg  daily for endometriosis dosing. However, this has not been studied as a contraceptive.  So, she might need a back-up form of  Contraception.   Discussed that surgery is the only definitive way to diagnose endometriosis. However, if we could keep her pain controlled on medication, we could potentially  avoid surgery. She was given a small amount of Tylenol #3 for her pain given her over-use of NSAIDs, as described by her.  Will follow up with her once she has seen her neurologist.    Thomasene MohairStephen Treyshaun Keatts, MD 04/09/2017 1:27 PM

## 2017-04-09 NOTE — Patient Instructions (Addendum)
I recommend norethindrone 5mg  by mouth daily. Check with your neurologist to make sure this medication is OK. Do NOT take any other medications with ibuprofen 800mg  that you are taking that are not prescribed.

## 2017-04-11 ENCOUNTER — Telehealth: Payer: Self-pay | Admitting: Certified Nurse Midwife

## 2017-04-11 LAB — PAP IG, CT-NG, RFX HPV ALL
Chlamydia, Nuc. Acid Amp: NEGATIVE
Gonococcus by Nucleic Acid Amp: NEGATIVE
PAP Smear Comment: 0

## 2017-04-11 LAB — HPV DNA PROBE HIGH RISK, AMPLIFIED: HPV, high-risk: POSITIVE — AB

## 2017-04-11 NOTE — Telephone Encounter (Signed)
Patient called with pap smear results: second ASCUS with +HRHPV. Explained results and need to follow up with another Pap smear next year. Patient to check to see if she received the Gardasil vaccine series.

## 2017-04-17 ENCOUNTER — Encounter: Payer: Self-pay | Admitting: Obstetrics and Gynecology

## 2017-04-19 ENCOUNTER — Telehealth: Payer: Self-pay | Admitting: Obstetrics and Gynecology

## 2017-04-19 DIAGNOSIS — R102 Pelvic and perineal pain: Principal | ICD-10-CM

## 2017-04-19 DIAGNOSIS — G8929 Other chronic pain: Secondary | ICD-10-CM

## 2017-04-19 MED ORDER — NORETHINDRONE ACETATE 5 MG PO TABS: 5.0000 mg | ORAL_TABLET | Freq: Every day | ORAL | 0 refills | Status: DC

## 2017-04-19 NOTE — Telephone Encounter (Signed)
Medication we discussed has been called in to pharmacy of record.  I will see how she is responding at her follow up visit.

## 2017-04-19 NOTE — Telephone Encounter (Signed)
Pt is schedule for first available appointment with Dr. Jean RosenthalJackson for a medication follow upon 05/07/17. Pt is requesting to be prescribe the medication that her and Dr. Jean RosenthalJackson spoke about. Please advise patient

## 2017-05-07 ENCOUNTER — Encounter: Payer: Self-pay | Admitting: Obstetrics and Gynecology

## 2017-05-07 ENCOUNTER — Ambulatory Visit (INDEPENDENT_AMBULATORY_CARE_PROVIDER_SITE_OTHER): Payer: Medicaid Other | Admitting: Obstetrics and Gynecology

## 2017-05-07 VITALS — BP 114/70 | Ht 63.0 in | Wt 150.0 lb

## 2017-05-07 DIAGNOSIS — Z23 Encounter for immunization: Secondary | ICD-10-CM

## 2017-05-07 DIAGNOSIS — G8929 Other chronic pain: Secondary | ICD-10-CM | POA: Diagnosis not present

## 2017-05-07 DIAGNOSIS — R102 Pelvic and perineal pain: Secondary | ICD-10-CM | POA: Diagnosis not present

## 2017-05-07 MED ORDER — HPV 9-VALENT RECOMB VACCINE IM SUSP: 0.5000 mL | INTRAMUSCULAR | 2 refills | Status: AC

## 2017-05-07 MED ORDER — NORETHINDRONE ACETATE 5 MG PO TABS: 5.0000 mg | ORAL_TABLET | Freq: Every day | ORAL | 2 refills | Status: DC

## 2017-05-07 NOTE — Progress Notes (Signed)
Obstetrics & Gynecology Office Visit   Chief Complaint  Patient presents with  . Follow-up    medication follow up     History of Present Illness: 21 y.o. [id number] female who presents in follow up after starting a new medication for pelvic pain. She is being treated presumptively for endometriosis.  She states that her pain has been well-controlled with the medication. She somehow does not have any pills left, though she filled the prescription on 7/14 and she had 30 pills.  She states that she has only taken one pill per day.  She started having bleeding once she stopped the medication a couple of days ago. She states that she saw her neurologist who cleared the medication.  She has had no side effects from the medication.   Past Medical History:  Diagnosis Date  . Asthma   . Migraine headache with aura   . Ovarian cyst 09/2013   left side  . Postpartum depression     Past Surgical History:  Procedure Laterality Date  . CHOLECYSTECTOMY  04/24/2011  . DILATION AND CURETTAGE OF UTERUS     for RPOC after elective AB    Gynecologic History: Patient's last menstrual period was 05/06/2017.  Obstetric History: [id number]  Family History  Problem Relation Age of Onset  . Diabetes Mother        ?CHTN  . Lung cancer Maternal Grandmother   . Pancreatic cancer Other   . Heart attack Maternal Grandfather     Social History   Social History  . Marital status: Single    Spouse name: N/A  . Number of children: 2  . Years of education: N/A   Occupational History  . [employer]    Social History Main Topics  . Smoking status: Never Smoker  . Smokeless tobacco: Never Used  . Alcohol use No  . Drug use: No  . Sexual activity: Yes    Partners: Male    Birth control/ protection: Pill   Other Topics Concern  . Not on file   Social History Narrative  . No narrative on file    Allergies  Allergen Reactions  . Tramadol Hives    Prior to Admission medications     Medication Sig Start Date End Date Taking? Authorizing Provider  acetaminophen-codeine (TYLENOL #3) 300-30 MG tablet Take 1 tablet by mouth daily as needed (breakthrough pain). 04/09/17  Yes Conard NovakJackson, Stephen D, MD  ibuprofen (ADVIL,MOTRIN) 800 MG tablet Take 1 tablet (800 mg total) by mouth every 8 (eight) hours as needed for headache, mild pain, moderate pain or cramping. 04/03/17  Yes Farrel ConnersGutierrez, Colleen, CNM  norethindrone (AYGESTIN) 5 MG tablet Take 1 tablet (5 mg total) by mouth daily. 04/19/17  Yes Conard NovakJackson, Stephen D, MD    Review of Systems  Constitutional: Negative.   HENT: Negative.   Eyes: Negative.   Respiratory: Negative.   Cardiovascular: Negative.   Gastrointestinal: Negative.   Genitourinary: Negative.   Musculoskeletal: Negative.   Skin: Negative.   Neurological: Negative.   Psychiatric/Behavioral: Negative.      Physical Exam BP 114/70   Ht 5\' 3"  (1.6 m)   Wt 150 lb (68 kg)   LMP 05/06/2017   BMI 26.57 kg/m  Patient's last menstrual period was 05/06/2017. Physical Exam  Constitutional: She is oriented to person, place, and time. She appears well-developed and well-nourished. No distress.  Eyes: Conjunctivae are normal. No scleral icterus.  Pulmonary/Chest: Effort normal.  Musculoskeletal: Normal range of motion.  Neurological: She  is alert and oriented to person, place, and time. No cranial nerve deficit.  Psychiatric: She has a normal mood and affect. Her behavior is normal. Judgment normal.   Assessment: 21 y.o. Z6X0960G3P2012 female here for management of pelvic pain, doing well on current medication.  Also, counseled regarding vaccination need for human papilloma virus.    Plan: Problem List Items Addressed This Visit    Chronic pelvic pain in female - Primary (Chronic)   Relevant Medications   norethindrone (AYGESTIN) 5 MG tablet    Other Visit Diagnoses    Need for HPV vaccine       Relevant Medications   HPV 9-Valent Recomb Vaccine SUSP     She is doing  well now from a pain standpoint.  Will continue current medication.  Follow up in 2 months.  Discussed benefits/risks of HPV vaccine. She understands that this is a 3-shot series. Will get first shot soon, then 2 months after that and then six months from first dose, per the CDC schedule.   20 minutes spent in face to face discussion with > 50% spent in counseling and management of her pelvic pain and need for HPV vaccine.   Thomasene MohairStephen Jackson, MD 05/07/2017 4:03 PM

## 2017-07-08 ENCOUNTER — Ambulatory Visit: Payer: Medicaid Other | Admitting: Obstetrics and Gynecology

## 2017-07-24 ENCOUNTER — Telehealth: Payer: Self-pay

## 2017-07-24 NOTE — Telephone Encounter (Signed)
Pt aware and states they are concerned b/c she had a stroke a few months ago.  Adv if necessary and STAT u/s could be ordered - to keep appt tomorrow.  [facility] issues c m'caid going thru.

## 2017-07-24 NOTE — Telephone Encounter (Signed)
Please advise 

## 2017-07-24 NOTE — Telephone Encounter (Signed)
Pt has appt tomorrow.  She was talking to someone in [place], they told her she was high risk preg and needed u/s.  She is not scheduled for an u/s tomorrow and would like to talk to provider about it.  [phone]

## 2017-07-24 NOTE — Telephone Encounter (Signed)
She might need an ultrasound. But, I can't make that determination without seeing her.  Since her appointment is tomorrow, let's get her in for her appointment. If there is high enough concern, we can get her a STAT ultrasound.  But, in reviewing her history, it's not apparent to me that this needs to occur prior to her appointment tomorrow.

## 2017-07-24 NOTE — Telephone Encounter (Signed)
VM not set up.

## 2017-07-25 ENCOUNTER — Ambulatory Visit (INDEPENDENT_AMBULATORY_CARE_PROVIDER_SITE_OTHER): Payer: Medicaid Other | Admitting: Obstetrics and Gynecology

## 2017-07-25 ENCOUNTER — Encounter: Payer: Self-pay | Admitting: Obstetrics and Gynecology

## 2017-07-25 VITALS — BP 114/80 | Wt 152.0 lb

## 2017-07-25 DIAGNOSIS — Z3480 Encounter for supervision of other normal pregnancy, unspecified trimester: Secondary | ICD-10-CM

## 2017-07-25 DIAGNOSIS — O219 Vomiting of pregnancy, unspecified: Secondary | ICD-10-CM

## 2017-07-25 DIAGNOSIS — Z113 Encounter for screening for infections with a predominantly sexual mode of transmission: Secondary | ICD-10-CM | POA: Diagnosis not present

## 2017-07-25 DIAGNOSIS — Z3A11 11 weeks gestation of pregnancy: Secondary | ICD-10-CM | POA: Diagnosis not present

## 2017-07-25 HISTORY — DX: Encounter for supervision of other normal pregnancy, unspecified trimester: Z34.80

## 2017-07-25 NOTE — Progress Notes (Signed)
New Obstetric Patient H&P   Chief Complaint: "Desires prenatal care"  History of Present Illness: Patient is a 21 y.o. Z6X0960 Not Hispanic or Latino female, LMP 05/06/17 presents with amenorrhea and positive home pregnancy test. Based on her  LMP, her EDD is Estimated Date of Delivery: 02/10/18 and her EGA is [redacted]w[redacted]d. Her last pap smear was 4 months ago and was ASCUS-HPV+   Since her LMP she claims she has experienced some headaches. She denies vaginal bleeding. Her past medical history is notable for complex migraines (not strokes). Her prior pregnancies are notable for no issues  Since her LMP, she admits to the use of tobacco products  no She claims she has gained zero pounds since the start of her pregnancy.  There are cats in the home in the home  no  She admits close contact with children on a regular basis  yes  She has had chicken pox in the past no She has had Tuberculosis exposures, symptoms, or previously tested positive for TB   no Current or past history of domestic violence. no  Genetic Screening/Teratology Counseling: (Includes patient, baby's father, or anyone in either family with:)   1. Patient's age >/= 15 at [facility]  no 2. Thalassemia ([place], [place], Mediterranean, or Asian background): MCV<80  no 3. Neural tube defect (meningomyelocele, spina bifida, anencephaly)  no 4. Congenital heart defect  no  5. Down syndrome  no 6. Tay-Sachs (Jewish, [place]  no 7. Canavan's Disease  no 8. Sickle cell disease or trait (African)  no  9. Hemophilia or other blood disorders  no  10. Muscular dystrophy  no  11. Cystic fibrosis  no  12. Huntington's Chorea  no  13. Mental retardation/autism  no 14. Other inherited genetic or chromosomal disorder  no 15. Maternal metabolic disorder (DM, PKU, etc)  no 16. Patient or FOB with a child with a birth defect not listed above no  16a. Patient or FOB with a birth defect themselves no 17. Recurrent pregnancy loss, or stillbirth  no   18. Any medications since LMP other than prenatal vitamins (include vitamins, supplements, OTC meds, drugs, alcohol)  no 19. Any other genetic/environmental exposure to discuss  no  Infection History:   1. Lives with someone with TB or TB exposed  no  2. Patient or partner has history of genital herpes  no 3. Rash or viral illness since LMP  no 4. History of STI (GC, CT, HPV, syphilis, HIV)  no 5. History of recent travel :  no  Other pertinent information:  no     Review of Systems:10 point review of systems negative unless otherwise noted in HPI  Past Medical History:  Diagnosis Date  . Asthma   . Migraine headache with aura   . Ovarian cyst 09/2013   left side  . Postpartum depression     Past Surgical History:  Procedure Laterality Date  . CHOLECYSTECTOMY  04/24/2011  . DILATION AND CURETTAGE OF UTERUS     for RPOC after elective AB    Gynecologic History: Patient's last menstrual period was 05/06/2017.  Obstetric History: [id number]  Family History  Problem Relation Age of Onset  . Diabetes Mother        ?CHTN  . Lung cancer Maternal Grandmother   . Pancreatic cancer Other   . Heart attack Maternal Grandfather     Social History   Social History  . Marital status: Single    Spouse name: N/A  . Number  of children: 2  . Years of education: N/A   Occupational History  . [employer]    Social History Main Topics  . Smoking status: Never Smoker  . Smokeless tobacco: Never Used  . Alcohol use No  . Drug use: No  . Sexual activity: Yes    Partners: Male    Birth control/ protection: None   Other Topics Concern  . Not on file   Social History Narrative  . No narrative on file    Allergies  Allergen Reactions  . Tramadol Hives    Prior to Admission medications: PNV    Physical Exam BP 114/80   Wt 152 lb (68.9 kg)   LMP 05/06/2017   BMI 26.93 kg/m   Physical Exam  Constitutional: She is oriented to person, place, and time. She  appears well-developed and well-nourished. No distress.  HENT:  Head: Normocephalic and atraumatic.  Eyes: Conjunctivae are normal.  Neck: Normal range of motion. Neck supple. No thyromegaly present.  Cardiovascular: Normal rate, regular rhythm and normal heart sounds.  Exam reveals no gallop and no friction rub.   No murmur heard. Pulmonary/Chest: Effort normal and breath sounds normal. She has no wheezes.  Abdominal: Soft. She exhibits no distension and no mass. There is no tenderness. There is no rebound and no guarding. No hernia. Hernia confirmed negative in the right inguinal area and confirmed negative in the left inguinal area.  Genitourinary: Vagina normal. Pelvic exam was performed with patient supine. There is no rash, tenderness or lesion on the right labia. There is no rash, tenderness or lesion on the left labia. Uterus is enlarged (10 weeks). Uterus is not tender. Cervix exhibits no motion tenderness and no discharge. Right adnexum displays no mass, no tenderness and no fullness. Left adnexum displays no mass, no tenderness and no fullness. No tenderness or bleeding in the vagina. No signs of injury around the vagina.  Musculoskeletal: Normal range of motion.  Lymphadenopathy:    She has no cervical adenopathy.       Right: No inguinal adenopathy present.       Left: No inguinal adenopathy present.  Neurological: She is alert and oriented to person, place, and time.  Skin: Skin is warm and dry. No rash noted.  Psychiatric: She has a normal mood and affect. Her behavior is normal. Judgment normal.    Female Chaperone present during breast and/or pelvic exam.  Assessment: 21 y.o. Z6X0960G4P2012 at 5771w3d presenting to initiate prenatal care  Plan: 1) Avoid alcoholic beverages. 2) Patient encouraged not to smoke.  3) Discontinue the use of all non-medicinal drugs and chemicals.  4) Take prenatal vitamins daily.  5) Nutrition, food safety (fish, cheese advisories, and high nitrite  foods) and exercise discussed. 6) Hospital and practice style discussed with cross coverage system.  7) Genetic Screening, such as with 1st Trimester Screening, cell free fetal DNA, AFP testing, and Ultrasound, as well as with amniocentesis and CVS as appropriate, is discussed with patient. At the conclusion of today's visit patient requested genetic testing 8) Patient is asked about travel to areas at risk for the Zika virus, and counseled to avoid travel and exposure to mosquitoes or sexual partners who may have themselves been exposed to the virus. Testing is discussed, and will be ordered as appropriate.   Thomasene MohairStephen Gwendolyn Nishi, MD 07/25/2017 2:31 PM

## 2017-07-26 ENCOUNTER — Encounter: Payer: Self-pay | Admitting: Obstetrics and Gynecology

## 2017-07-26 LAB — RPR+RH+ABO+RUB AB+AB SCR+CB...
ANTIBODY SCREEN: NEGATIVE
HIV SCREEN 4TH GENERATION: NONREACTIVE
Hematocrit: 37.2 % (ref 34.0–46.6)
Hemoglobin: 12.3 g/dL (ref 11.1–15.9)
Hepatitis B Surface Ag: NEGATIVE
MCH: 29.3 pg (ref 26.6–33.0)
MCHC: 33.1 g/dL (ref 31.5–35.7)
MCV: 89 fL (ref 79–97)
PLATELETS: 234 10*3/uL (ref 150–379)
RBC: 4.2 x10E6/uL (ref 3.77–5.28)
RDW: 13.3 % (ref 12.3–15.4)
RH TYPE: POSITIVE
RPR: NONREACTIVE
RUBELLA: 1.38 {index} (ref >0.99)
VARICELLA: 339 {index} (ref >165)
WBC: 7.1 10*3/uL (ref 3.4–10.8)

## 2017-07-26 MED ORDER — DOXYLAMINE-PYRIDOXINE ER 20-20 MG PO TBCR: 1.0000 | EXTENDED_RELEASE_TABLET | Freq: Two times a day (BID) | ORAL | 3 refills | Status: AC

## 2017-07-26 NOTE — Telephone Encounter (Signed)
Pt triage message states pharamcy is waiting for authorization from Medicaid on her rx. [phone]

## 2017-07-26 NOTE — Addendum Note (Signed)
Addended by: Thomasene MohairJACKSON, STEPHEN D on: 07/26/2017 01:34 PM   Modules accepted: Orders

## 2017-07-28 LAB — GC/CHLAMYDIA PROBE AMP
CHLAMYDIA, DNA PROBE: NEGATIVE
NEISSERIA GONORRHOEAE BY PCR: NEGATIVE

## 2017-07-28 LAB — URINE CULTURE: ORGANISM ID, BACTERIA: NO GROWTH

## 2017-08-05 ENCOUNTER — Ambulatory Visit (INDEPENDENT_AMBULATORY_CARE_PROVIDER_SITE_OTHER): Payer: Medicaid Other

## 2017-08-05 ENCOUNTER — Ambulatory Visit (INDEPENDENT_AMBULATORY_CARE_PROVIDER_SITE_OTHER): Payer: Medicaid Other | Admitting: Obstetrics and Gynecology

## 2017-08-05 VITALS — BP 116/74 | Wt 152.0 lb

## 2017-08-05 DIAGNOSIS — Z362 Encounter for other antenatal screening follow-up: Secondary | ICD-10-CM | POA: Diagnosis not present

## 2017-08-05 DIAGNOSIS — Z3480 Encounter for supervision of other normal pregnancy, unspecified trimester: Secondary | ICD-10-CM

## 2017-08-05 DIAGNOSIS — Z3A10 10 weeks gestation of pregnancy: Secondary | ICD-10-CM

## 2017-08-05 NOTE — Progress Notes (Signed)
  Routine Prenatal Care Visit  Subjective  Deanna Harris is a 21 y.o. [phone] at 673w2d being seen today for ongoing prenatal care.  She is currently monitored for the following issues for this low-risk pregnancy and has Migraine aura, persistent, intractable; Chronic pelvic pain in female; Atypical squamous cell changes of undetermined significance (ASCUS) on cervical cytology with positive high risk human papilloma virus (HPV); and Supervision of other normal pregnancy, antepartum on her problem list.  ----------------------------------------------------------------------------------- Patient reports vomiting.    . Vag. Bleeding: None.   . Denies leaking of fluid.  ----------------------------------------------------------------------------------- The following portions of the patient's history were reviewed and updated as appropriate: allergies, current medications, past family history, past medical history, past social history, past surgical history and problem list. Problem list updated. Objective  Blood pressure 116/74, weight 152 lb (68.9 kg), last menstrual period 05/06/2017. Pregravid weight 152 lb (68.9 kg) Total Weight Gain 0 lb (0 kg) Urinalysis: Urine Protein: Negative Urine Glucose: Negative  Fetal Status: Fetal Heart Rate (bpm): present         General:  Alert, oriented and cooperative. Patient is in no acute distress.  Skin: Skin is warm and dry. No rash noted.   Cardiovascular: Normal heart rate noted  Respiratory: Normal respiratory effort, no problems with respiration noted  Abdomen: Soft, gravid, appropriate for gestational age.       Pelvic:  Cervical exam deferred        Extremities: Normal range of motion.     Mental Status: Normal mood and affect. Normal behavior. Normal judgment and thought content.   Assessment   21 y.o. J4N8295G4P2012 at 3873w2d by  03/01/2018, by Ultrasound presenting for routine prenatal visit  Plan   pregnancy Problems (from 07/25/17 to  present)    Problem Noted Resolved   Supervision of other normal pregnancy, antepartum 07/25/2017 by Conard NovakJackson, Brytnee Bechler D, MD No   Overview Signed 08/05/2017 11:37 AM by Conard NovakJackson, Zeph Riebel D, MD    Clinic [facility] Prenatal Labs  Dating 10 wk u/s Blood type: AB/Positive/-- (10/18 1519)   Genetic Screen 1 Screen: [ ]  desires   AFP:     Quad:     NIPS: Antibody:Negative (10/18 1519)  Anatomic US  Rubella: 1.38 (10/18 1519)  Varicella: immune  GTT Early:               Third trimester:  RPR: Non Reactive (10/18 1519)   Rhogam n/a HBsAg: Negative (10/18 1519)   TDaP vaccine                       Flu Shot: HIV:   Neg  Baby Food                                GBS:   Contraception  Pap:  CBB     CS/VBAC    Support Person            Please refer to After Visit Summary for other counseling recommendations.   Return in about 2 weeks (around 08/19/2017) for schedule u/s for NT and Routine prenatal after.  Thomasene MohairStephen Kacey Vicuna, MD  08/05/2017 11:54 AM

## 2017-08-06 ENCOUNTER — Encounter: Payer: Self-pay | Admitting: Obstetrics and Gynecology

## 2017-08-19 ENCOUNTER — Ambulatory Visit (INDEPENDENT_AMBULATORY_CARE_PROVIDER_SITE_OTHER): Payer: Medicaid Other | Admitting: Advanced Practice Midwife

## 2017-08-19 ENCOUNTER — Ambulatory Visit (INDEPENDENT_AMBULATORY_CARE_PROVIDER_SITE_OTHER): Payer: Medicaid Other

## 2017-08-19 ENCOUNTER — Encounter: Payer: Self-pay | Admitting: Advanced Practice Midwife

## 2017-08-19 VITALS — BP 118/70 | Wt 150.0 lb

## 2017-08-19 DIAGNOSIS — Z362 Encounter for other antenatal screening follow-up: Secondary | ICD-10-CM | POA: Diagnosis not present

## 2017-08-19 DIAGNOSIS — Z1379 Encounter for other screening for genetic and chromosomal anomalies: Secondary | ICD-10-CM

## 2017-08-19 DIAGNOSIS — Z3480 Encounter for supervision of other normal pregnancy, unspecified trimester: Secondary | ICD-10-CM | POA: Diagnosis not present

## 2017-08-19 DIAGNOSIS — Z3A12 12 weeks gestation of pregnancy: Secondary | ICD-10-CM

## 2017-08-19 NOTE — Progress Notes (Signed)
  Routine Prenatal Care Visit  Subjective  Deanna Harris is a 21 y.o. [phone] at 6750w2d being seen today for ongoing prenatal care.  She is currently monitored for the following issues for this low-risk pregnancy and has Migraine aura, persistent, intractable; Chronic pelvic pain in female; Atypical squamous cell changes of undetermined significance (ASCUS) on cervical cytology with positive high risk human papilloma virus (HPV); and Supervision of other normal pregnancy, antepartum on their problem list.  ----------------------------------------------------------------------------------- Patient reports no complaints.    . Vag. Bleeding: None.   . Denies leaking of fluid.  ----------------------------------------------------------------------------------- The following portions of the patient's history were reviewed and updated as appropriate: allergies, current medications, past family history, past medical history, past social history, past surgical history and problem list. Problem list updated.   Objective  Blood pressure 118/70, weight 150 lb (68 kg), last menstrual period 05/06/2017. Pregravid weight 152 lb (68.9 kg) Total Weight Gain  (-0.907 kg) Urinalysis: Urine Protein: Negative Urine Glucose: Negative  Fetal Status:           General:  Alert, oriented and cooperative. Patient is in no acute distress.  Skin: Skin is warm and dry. No rash noted.   Cardiovascular: Normal heart rate noted  Respiratory: Normal respiratory effort, no problems with respiration noted  Abdomen: Soft, gravid, appropriate for gestational age.       Pelvic:  Cervical exam deferred        Extremities: Normal range of motion.     Mental Status: Normal mood and affect. Normal behavior. Normal judgment and thought content.   Assessment   21 y.o. [id number] at 3250w2d by  03/01/2018, by Ultrasound presenting for routine prenatal visit  Plan   pregnancy Problems (from 07/25/17 to present)    Problem Noted  Resolved   Supervision of other normal pregnancy, antepartum 07/25/2017 by Conard NovakJackson, Stephen D, MD No   Overview Signed 08/05/2017 11:37 AM by Conard NovakJackson, Stephen D, MD    [facility] Prenatal Labs  Dating 10 wk u/s Blood type: AB/Positive/-- (10/18 1519)   Genetic Screen 1 Screen: [ ]  desires   AFP:     Quad:     NIPS: Antibody:Negative (10/18 1519)  Anatomic US  Rubella: 1.38 (10/18 1519)  Varicella: immune  GTT Early:               Third trimester:  RPR: Non Reactive (10/18 1519)   Rhogam n/a HBsAg: Negative (10/18 1519)   TDaP vaccine                       Flu Shot: HIV:   Neg  Baby Food                                GBS:   Contraception  Pap:  CBB     CS/VBAC    Support Person               First trimester screen today.  Preterm labor symptoms and general obstetric precautions including but not limited to vaginal bleeding, contractions, leaking of fluid and fetal movement were reviewed in detail with the patient. Please refer to After Visit Summary for other counseling recommendations.   Return in about 4 weeks (around 09/16/2017) for rob.  Tresea MallJane Vada Swift, CNM  08/19/2017 9:59 AM

## 2017-08-19 NOTE — Patient Instructions (Signed)

## 2017-08-19 NOTE — Progress Notes (Signed)
NT scan today

## 2017-08-21 LAB — FIRST TRIMESTER SCREEN W/NT
CRL: 60.6 mm
DIA MoM: 0.5
DIA VALUE: 124.9 pg/mL
Gest Age-Collect: 12.4 weeks
HCG MOM: 0.56
HCG VALUE: 50.3 [IU]/mL
MATERNAL AGE AT EDD: 24.3 a
Nuchal Translucency MoM: 0.84
Nuchal Translucency: 1.3 mm
Number of Fetuses: 1
PAPP-A MOM: 0.98
PAPP-A Value: 886.3 ng/mL
Test Results:: NEGATIVE
Weight: 150 [lb_av]

## 2017-09-12 ENCOUNTER — Encounter: Payer: Self-pay | Admitting: Obstetrics and Gynecology

## 2017-09-12 ENCOUNTER — Ambulatory Visit (INDEPENDENT_AMBULATORY_CARE_PROVIDER_SITE_OTHER): Payer: Medicaid Other | Admitting: Obstetrics and Gynecology

## 2017-09-12 VITALS — BP 118/76 | Wt 150.0 lb

## 2017-09-12 DIAGNOSIS — Z3480 Encounter for supervision of other normal pregnancy, unspecified trimester: Secondary | ICD-10-CM

## 2017-09-12 DIAGNOSIS — Z3A15 15 weeks gestation of pregnancy: Secondary | ICD-10-CM

## 2017-09-12 NOTE — Progress Notes (Signed)
  Routine Prenatal Care Visit Subjective  Deanna Harris is a 21 y.o. [phone] at 850w5d being seen today for ongoing prenatal care.  She is currently monitored for the following issues for this low-risk pregnancy and has Migraine aura, persistent, intractable; Chronic pelvic pain in female; Atypical squamous cell changes of undetermined significance (ASCUS) on cervical cytology with positive high risk human papilloma virus (HPV); and Supervision of other normal pregnancy, antepartum on their problem list.  ----------------------------------------------------------------------------------- Patient reports no complaints.    . Vag. Bleeding: None.  Movement: Absent. Denies leaking of fluid.  ----------------------------------------------------------------------------------- The following portions of the patient's history were reviewed and updated as appropriate: allergies, current medications, past family history, past medical history, past social history, past surgical history and problem list. Problem list updated. Objective  Blood pressure 118/76, weight 150 lb (68 kg), last menstrual period 05/06/2017. Pregravid weight 152 lb (68.9 kg) Total Weight Gain  (-0.907 kg) Urinalysis: Urine Protein: Negative Urine Glucose: Negative  Fetal Status: Fetal Heart Rate (bpm): 145   Movement: Absent     General:  Alert, oriented and cooperative. Patient is in no acute distress.  Skin: Skin is warm and dry. No rash noted.   Cardiovascular: Normal heart rate noted  Respiratory: Normal respiratory effort, no problems with respiration noted  Abdomen: Soft, gravid, appropriate for gestational age. Pain/Pressure: Absent     Pelvic:  Cervical exam deferred        Extremities: Normal range of motion.     Mental Status: Normal mood and affect. Normal behavior. Normal judgment and thought content.   Assessment   21 y.o. [id number] at 6150w5d by  03/01/2018, by Ultrasound presenting for routine prenatal  visit  Plan   pregnancy Problems (from 07/25/17 to present)    Problem Noted Resolved   Supervision of other normal pregnancy, antepartum 07/25/2017 by Conard NovakJackson, Marvell Stavola D, MD No   Overview Addendum 09/12/2017 11:19 AM by Conard NovakJackson, Maclovia Uher D, MD    [facility] Prenatal Labs  Dating 10 wk u/s Blood type: AB/Positive/-- (10/18 1519)   Genetic Screen 1 Screen: neg   AFP:      Antibody:Negative (10/18 1519)  Anatomic US  Rubella: 1.38 (10/18 1519)  Varicella: immune  GTT Early:               Third trimester:  RPR: Non Reactive (10/18 1519)   Rhogam n/a HBsAg: Negative (10/18 1519)   TDaP vaccine                       Flu Shot: HIV:   Neg  Baby Food                                GBS:   Contraception  Pap:  CBB     CS/VBAC    Support Person           Please refer to After Visit Summary for other counseling recommendations.   Return in about 4 weeks (around 10/10/2017) for schedule u/s for anatomy and routine prenatal after.  Thomasene MohairStephen Kimla Furth, MD  09/12/2017 11:27 AM

## 2017-09-16 ENCOUNTER — Encounter: Payer: Medicaid Other | Admitting: Maternal Newborn

## 2017-10-08 NOTE — L&D Delivery Note (Signed)
Date of delivery: 02/18/2018 Estimated Date of Delivery: 03/01/18 Patient's last menstrual period was 05/06/2017. EGA: [redacted]w[redacted]d  Delivery Note At 4:32 AM a viable female was delivered via Vaginal, Spontaneous  (Presentation: OA;  ROA).   APGAR: 9, 9; weight  3220 g.   Placenta status: spontaneous, intact.   Cord:  with the following complications: none.  Cord pH: NA  Called to see patient.  Mom pushed to deliver a viable female infant.  The head followed by shoulders, which delivered without difficulty, and the rest of the body.  Nuchal cord and body cord noted which were reduced following delivery of baby. Vigorous baby to mom's chest.  Cord clamped and cut after 4 min delay.  No cord blood obtained.  Placenta delivered spontaneously, intact, with a 3-vessel cord.  All counts correct.  Hemostasis obtained with IV pitocin and fundal massage.   Anesthesia:  epidural Episiotomy: None Lacerations: Labial Suture Repair: hemostatic not repaired Est. Blood Loss (mL):  200  Mom to postpartum.  Baby to Couplet care / Skin to Skin.  Tresea Mall, CNM 02/18/2018, 5:04 AM

## 2017-10-10 ENCOUNTER — Ambulatory Visit (INDEPENDENT_AMBULATORY_CARE_PROVIDER_SITE_OTHER): Payer: Medicaid Other

## 2017-10-10 ENCOUNTER — Ambulatory Visit (INDEPENDENT_AMBULATORY_CARE_PROVIDER_SITE_OTHER): Payer: Medicaid Other | Admitting: Obstetrics and Gynecology

## 2017-10-10 ENCOUNTER — Encounter: Payer: Self-pay | Admitting: Obstetrics and Gynecology

## 2017-10-10 VITALS — BP 116/74 | Wt 151.0 lb

## 2017-10-10 DIAGNOSIS — Z3480 Encounter for supervision of other normal pregnancy, unspecified trimester: Secondary | ICD-10-CM

## 2017-10-10 DIAGNOSIS — Z362 Encounter for other antenatal screening follow-up: Secondary | ICD-10-CM | POA: Diagnosis not present

## 2017-10-10 DIAGNOSIS — Z3A19 19 weeks gestation of pregnancy: Secondary | ICD-10-CM

## 2017-10-10 NOTE — Progress Notes (Signed)
  Routine Prenatal Care Visit  Subjective  Deanna Harris is a 22 y.o. [phone] at 8295w5d being seen today for ongoing prenatal care.  She is currently monitored for the following issues for this low-risk pregnancy and has Migraine aura, persistent, intractable; Chronic pelvic pain in female; Atypical squamous cell changes of undetermined significance (ASCUS) on cervical cytology with positive high risk human papilloma virus (HPV); and Supervision of other normal pregnancy, antepartum on their problem list.  ----------------------------------------------------------------------------------- Patient reports no complaints.    . Vag. Bleeding: None.  Movement: Present. Denies leaking of fluid.  Anatomy screen complete ----------------------------------------------------------------------------------- The following portions of the patient's history were reviewed and updated as appropriate: allergies, current medications, past family history, past medical history, past social history, past surgical history and problem list. Problem list updated.  Objective  Blood pressure 116/74, weight 151 lb (68.5 kg), last menstrual period 05/06/2017. Pregravid weight 152 lb (68.9 kg) Total Weight Gain  (-0.454 kg) Urinalysis: Urine Protein: Negative Urine Glucose: Negative  Fetal Status: Fetal Heart Rate (bpm): 140   Movement: Present     General:  Alert, oriented and cooperative. Patient is in no acute distress.  Skin: Skin is warm and dry. No rash noted.   Cardiovascular: Normal heart rate noted  Respiratory: Normal respiratory effort, no problems with respiration noted  Abdomen: Soft, gravid, appropriate for gestational age. Pain/Pressure: Absent     Pelvic:  Cervical exam deferred        Extremities: Normal range of motion.     Mental Status: Normal mood and affect. Normal behavior. Normal judgment and thought content.   Assessment   22 y.o. [id number] at 5595w5d by  03/01/2018, by Ultrasound presenting  for routine prenatal visit  Plan   pregnancy Problems (from 07/25/17 to present)    Problem Noted Resolved   Supervision of other normal pregnancy, antepartum 07/25/2017 by Conard NovakJackson, Cobe Viney D, MD No   Overview Addendum 09/12/2017 11:19 AM by Conard NovakJackson, Anapaula Severt D, MD    [facility] Prenatal Labs  Dating 10 wk u/s Blood type: AB/Positive/-- (10/18 1519)   Genetic Screen 1 Screen: neg   AFP:      Antibody:Negative (10/18 1519)  Anatomic US  Rubella: 1.38 (10/18 1519)  Varicella: immune  GTT Early:               Third trimester:  RPR: Non Reactive (10/18 1519)   Rhogam n/a HBsAg: Negative (10/18 1519)   TDaP vaccine                       Flu Shot: HIV:   Neg  Baby Food                                GBS:   Contraception  Pap:  CBB     CS/VBAC    Support Person            Preterm labor symptoms and general obstetric precautions including but not limited to vaginal bleeding, contractions, leaking of fluid and fetal movement were reviewed in detail with the patient. Please refer to After Visit Summary for other counseling recommendations.   Return in about 4 weeks (around 11/07/2017) for Routine Prenatal Appointment.  Thomasene MohairStephen Davonna Ertl, MD  10/10/2017 10:05 AM

## 2017-10-23 ENCOUNTER — Encounter: Payer: Self-pay | Admitting: Obstetrics and Gynecology

## 2017-11-07 ENCOUNTER — Ambulatory Visit (INDEPENDENT_AMBULATORY_CARE_PROVIDER_SITE_OTHER): Payer: Medicaid Other | Admitting: Obstetrics and Gynecology

## 2017-11-07 VITALS — BP 120/76 | Wt 151.0 lb

## 2017-11-07 DIAGNOSIS — G43519 Persistent migraine aura without cerebral infarction, intractable, without status migrainosus: Secondary | ICD-10-CM

## 2017-11-07 DIAGNOSIS — Z3480 Encounter for supervision of other normal pregnancy, unspecified trimester: Secondary | ICD-10-CM

## 2017-11-07 DIAGNOSIS — Z3A23 23 weeks gestation of pregnancy: Secondary | ICD-10-CM

## 2017-11-07 MED ORDER — PROCHLORPERAZINE MALEATE 10 MG PO TABS: 10.0000 mg | ORAL_TABLET | Freq: Four times a day (QID) | ORAL | 3 refills | Status: DC | PRN

## 2017-11-07 NOTE — Progress Notes (Signed)
Headache/left side face numbness/tongue numb this morning ROB

## 2017-11-07 NOTE — Progress Notes (Signed)
    Routine Prenatal Care Visit  Subjective  Deanna Harris is a 22 y.o. [phone] at 373w5d being seen today for ongoing prenatal care.  She is currently monitored for the following issues for this high-risk pregnancy and has Migraine aura, persistent, intractable; Chronic pelvic pain in female; Atypical squamous cell changes of undetermined significance (ASCUS) on cervical cytology with positive high risk human papilloma virus (HPV); and Supervision of other normal pregnancy, antepartum on their problem list.  ----------------------------------------------------------------------------------- Patient reports headache.   Contractions: Not present. Vag. Bleeding: None.  Movement: Present. Denies leaking of fluid.  ----------------------------------------------------------------------------------- The following portions of the patient's history were reviewed and updated as appropriate: allergies, current medications, past family history, past medical history, past social history, past surgical history and problem list. Problem list updated.   Objective  Weight 151 lb (68.5 kg), last menstrual period 05/06/2017. Pregravid weight 152 lb (68.9 kg) Total Weight Gain  (-0.454 kg) Urinalysis: Urine Protein: 1+ Urine Glucose: Negative  Fetal Status: Fetal Heart Rate (bpm): 140   Movement: Present     General:  Alert, oriented and cooperative. Patient is in no acute distress.  Skin: Skin is warm and dry. No rash noted.   Cardiovascular: Normal heart rate noted  Respiratory: Normal respiratory effort, no problems with respiration noted  Abdomen: Soft, gravid, appropriate for gestational age. Pain/Pressure: Absent     Pelvic:  Cervical exam deferred        Extremities: Normal range of motion.     ental Status: Normal mood and affect. Normal behavior. Normal judgment and thought content.     Assessment   22 y.o. [id number] at 273w5d by  03/01/2018, by Ultrasound presenting for routine prenatal  visit  Plan   pregnancy Problems (from 07/25/17 to present)    Problem Noted Resolved   Supervision of other normal pregnancy, antepartum 07/25/2017 by Conard NovakJackson, Stephen D, MD No   Overview Addendum 11/07/2017  8:20 AM by Vena AustriaStaebler, Zorawar Strollo, MD    [facility] Prenatal Labs  Dating 10 wk u/s Blood type: AB/Positive/-- (10/18 1519)   Genetic Screen 1 Screen: neg        Antibody:Negative (10/18 1519)  Anatomic US NORMAL FEMALE Rubella: 1.38 (10/18 1519)  Varicella: immune  GTT Third trimester:  RPR: Non Reactive (10/18 1519)   Rhogam n/a HBsAg: Negative (10/18 1519)   TDaP vaccine                       Flu Shot: HIV:   Neg  Baby Food                                GBS:   Contraception  Pap:  CBB     CS/VBAC    Support Person                Preterm labor symptoms and general obstetric precautions including but not limited to vaginal bleeding, contractions, leaking of fluid and fetal movement were reviewed in detail with the patient. Please refer to After Visit Summary for other counseling recommendations.  - History of Migraine with Aura and similar symptoms, work up negative for TIA at that time, trial of compazine if worsening of symptoms to present to ER - 28 week labs next visit  Return in about 4 weeks (around 12/05/2017) for rob and 28 week labs.

## 2017-11-29 ENCOUNTER — Encounter: Payer: Self-pay | Admitting: Obstetrics and Gynecology

## 2017-12-05 ENCOUNTER — Encounter: Payer: Self-pay | Admitting: Maternal Newborn

## 2017-12-05 ENCOUNTER — Ambulatory Visit (INDEPENDENT_AMBULATORY_CARE_PROVIDER_SITE_OTHER): Payer: Medicaid Other | Admitting: Maternal Newborn

## 2017-12-05 ENCOUNTER — Other Ambulatory Visit: Payer: Medicaid Other

## 2017-12-05 VITALS — BP 100/70 | Wt 157.0 lb

## 2017-12-05 DIAGNOSIS — Z3A23 23 weeks gestation of pregnancy: Secondary | ICD-10-CM

## 2017-12-05 DIAGNOSIS — Z3A27 27 weeks gestation of pregnancy: Secondary | ICD-10-CM

## 2017-12-05 DIAGNOSIS — Z3480 Encounter for supervision of other normal pregnancy, unspecified trimester: Secondary | ICD-10-CM

## 2017-12-05 NOTE — Progress Notes (Signed)
C/o can't do anything c left hand, fingers started tingling last week, not it feels like it's asleep up to elbow, can't hold anything - drops item.rj

## 2017-12-05 NOTE — Progress Notes (Signed)
    Routine Prenatal Care Visit  Subjective  Deanna Harris is a 22 y.o. [phone] at 4630w5d being seen today for ongoing prenatal care.  She is currently monitored for the following issues for this low-risk pregnancy and has Migraine aura, persistent, intractable; Chronic pelvic pain in female; Atypical squamous cell changes of undetermined significance (ASCUS) on cervical cytology with positive high risk human papilloma virus (HPV); and Supervision of other normal pregnancy, antepartum on their problem list.  ----------------------------------------------------------------------------------- Patient reports carpal tunnel symptoms.  She has numbness and tingling in her left hand/arm, worse after sleeping. She is finding it hard to hold items and drive with that hand. Contractions: Not present. Vag. Bleeding: None.  Movement: Present. Denies leaking of fluid.  ----------------------------------------------------------------------------------- The following portions of the patient's history were reviewed and updated as appropriate: allergies, current medications, past family history, past medical history, past social history, past surgical history and problem list. Problem list updated.   Objective  Blood pressure 100/70, weight 157 lb (71.2 kg), last menstrual period 05/06/2017. Pregravid weight 152 lb (68.9 kg) Total Weight Gain 5 lb (2.268 kg) Urinalysis:      Fetal Status: Fetal Heart Rate (bpm): 144 Fundal Height: 27 cm Movement: Present     General:  Alert, oriented and cooperative. Patient is in no acute distress.  Skin: Skin is warm and dry. No rash noted.   Cardiovascular: Normal heart rate noted  Respiratory: Normal respiratory effort, no problems with respiration noted  Abdomen: Soft, gravid, appropriate for gestational age. Pain/Pressure: Absent     Pelvic:  Cervical exam deferred        Extremities: Normal range of motion.  Edema: None  Mental Status: Normal mood and affect.  Normal behavior. Normal judgment and thought content.     Assessment   22 y.o. [id number] at 2630w5d, EDD 03/01/2018 by Ultrasound presenting for routine prenatal visit.  Plan   pregnancy Problems (from 07/25/17 to present)    Problem Noted Resolved   Supervision of other normal pregnancy, antepartum 07/25/2017 by Conard NovakJackson, Stephen D, MD No   Overview Addendum 11/07/2017  8:20 AM by Vena AustriaStaebler, Andreas, MD    [facility] Prenatal Labs  Dating 10 wk u/s Blood type: AB/Positive/-- (10/18 1519)   Genetic Screen 1 Screen: neg        Antibody:Negative (10/18 1519)  Anatomic US NORMAL FEMALE Rubella: 1.38 (10/18 1519)  Varicella: immune  GTT Third trimester:  RPR: Non Reactive (10/18 1519)   Rhogam n/a HBsAg: Negative (10/18 1519)   TDaP vaccine                       Flu Shot: HIV:   Neg  Baby Food                                GBS:   Contraception  Pap:  CBB     CS/VBAC    Support Person             Advised that symptoms in her hand and arm are most likely due to carpal tunnel syndrome, and that she should obtain a wrist brace and wear it when she is sleeping and during the day as tolerated.  Preterm labor symptoms and general obstetric precautions were reviewed with the patient.  Return in about 2 weeks (around 12/19/2017) for ROB.  Marcelyn BruinsJacelyn Schmid, CNM 12/05/2017  9:55 AM

## 2017-12-06 ENCOUNTER — Encounter: Payer: Self-pay | Admitting: Obstetrics and Gynecology

## 2017-12-06 LAB — 28 WEEK RH+PANEL
BASOS: 0 %
Basophils Absolute: 0 10*3/uL (ref 0.0–0.2)
EOS (ABSOLUTE): 0 10*3/uL (ref 0.0–0.4)
Eos: 0 %
Gestational Diabetes Screen: 128 mg/dL (ref 65–139)
HEMOGLOBIN: 11.3 g/dL (ref 11.1–15.9)
HIV SCREEN 4TH GENERATION: NONREACTIVE
Hematocrit: 32.3 % — ABNORMAL LOW (ref 34.0–46.6)
IMMATURE GRANS (ABS): 0 10*3/uL (ref 0.0–0.1)
IMMATURE GRANULOCYTES: 0 %
LYMPHS: 15 %
Lymphocytes Absolute: 1 10*3/uL (ref 0.7–3.1)
MCH: 30.1 pg (ref 26.6–33.0)
MCHC: 35 g/dL (ref 31.5–35.7)
MCV: 86 fL (ref 79–97)
MONOCYTES: 4 %
Monocytes Absolute: 0.3 10*3/uL (ref 0.1–0.9)
NEUTROS ABS: 5.1 10*3/uL (ref 1.4–7.0)
NEUTROS PCT: 81 %
Platelets: 164 10*3/uL (ref 150–379)
RBC: 3.76 x10E6/uL — AB (ref 3.77–5.28)
RDW: 13.7 % (ref 12.3–15.4)
RPR Ser Ql: NONREACTIVE
WBC: 6.4 10*3/uL (ref 3.4–10.8)

## 2017-12-19 ENCOUNTER — Ambulatory Visit (INDEPENDENT_AMBULATORY_CARE_PROVIDER_SITE_OTHER): Payer: Medicaid Other | Admitting: Obstetrics and Gynecology

## 2017-12-19 VITALS — BP 100/66 | Wt 158.0 lb

## 2017-12-19 DIAGNOSIS — Z3A29 29 weeks gestation of pregnancy: Secondary | ICD-10-CM

## 2017-12-19 DIAGNOSIS — Z3480 Encounter for supervision of other normal pregnancy, unspecified trimester: Secondary | ICD-10-CM

## 2017-12-19 NOTE — Progress Notes (Signed)
ROB Carpal Tunnel

## 2017-12-19 NOTE — Progress Notes (Signed)
Discussed wrist splints for carpel tunnel at night

## 2018-01-02 ENCOUNTER — Encounter: Payer: Medicaid Other | Admitting: Obstetrics and Gynecology

## 2018-01-03 ENCOUNTER — Ambulatory Visit (INDEPENDENT_AMBULATORY_CARE_PROVIDER_SITE_OTHER): Payer: Medicaid Other | Admitting: Obstetrics and Gynecology

## 2018-01-03 VITALS — BP 102/56 | Wt 157.0 lb

## 2018-01-03 DIAGNOSIS — Z3A30 30 weeks gestation of pregnancy: Secondary | ICD-10-CM

## 2018-01-03 DIAGNOSIS — R87810 Cervical high risk human papillomavirus (HPV) DNA test positive: Secondary | ICD-10-CM

## 2018-01-03 DIAGNOSIS — Z3480 Encounter for supervision of other normal pregnancy, unspecified trimester: Secondary | ICD-10-CM

## 2018-01-03 DIAGNOSIS — R87610 Atypical squamous cells of undetermined significance on cytologic smear of cervix (ASC-US): Secondary | ICD-10-CM

## 2018-01-03 DIAGNOSIS — Z23 Encounter for immunization: Secondary | ICD-10-CM

## 2018-01-03 NOTE — Progress Notes (Signed)
Routine Prenatal Care Visit  Subjective  Deanna Harris is a 22 y.o. [phone] at 5672w6d being seen today for ongoing prenatal care.  She is currently monitored for the following issues for this low-risk pregnancy and has Migraine aura, persistent, intractable; Chronic pelvic pain in female; Atypical squamous cell changes of undetermined significance (ASCUS) on cervical cytology with positive high risk human papilloma virus (HPV); and Supervision of other normal pregnancy, antepartum on their problem list.  ----------------------------------------------------------------------------------- Patient reports no complaints.   Contractions: Not present. Vag. Bleeding: None.  Movement: Present. Denies leaking of fluid.  ----------------------------------------------------------------------------------- The following portions of the patient's history were reviewed and updated as appropriate: allergies, current medications, past family history, past medical history, past social history, past surgical history and problem list. Problem list updated.   Objective  Blood pressure (!) 102/56, weight 157 lb (71.2 kg), last menstrual period 05/06/2017. Pregravid weight 152 lb (68.9 kg) Total Weight Gain 5 lb (2.268 kg) Urinalysis: Urine Protein: Negative Urine Glucose: Negative  Fetal Status: Fetal Heart Rate (bpm): 140 Fundal Height: 30 cm Movement: Present     General:  Alert, oriented and cooperative. Patient is in no acute distress.  Skin: Skin is warm and dry. No rash noted.   Cardiovascular: Normal heart rate noted  Respiratory: Normal respiratory effort, no problems with respiration noted  Abdomen: Soft, gravid, appropriate for gestational age. Pain/Pressure: Absent     Pelvic:  Cervical exam deferred        Extremities: Normal range of motion.     ental Status: Normal mood and affect. Normal behavior. Normal judgment and thought content.     Assessment   22 y.o. [id number] at 3872w6d by   03/01/2018, by Ultrasound presenting for routine prenatal visit  Plan   pregnancy Problems (from 07/25/17 to present)    Problem Noted Resolved   Supervision of other normal pregnancy, antepartum 07/25/2017 by Conard NovakJackson, Stephen D, MD No   Overview Addendum 01/03/2018  2:34 PM by Vena AustriaStaebler, Colena Ketterman, MD    [facility] Prenatal Labs  Dating 10 wk u/s Blood type: AB/Positive/-- (10/18 1519)   Genetic Screen 1 Screen: neg        Antibody:Negative (10/18 1519)  Anatomic US NORMAL FEMALE Rubella: 1.38 (10/18 1519)  Varicella: immune  GTT 128 RPR: Non Reactive (10/18 1519)   Rhogam n/a HBsAg: Negative (10/18 1519)   TDaP vaccine 01/03/2018 HIV:   Neg  Baby Food Bottle                       GBS:   Contraception Minipill Pap: 04/03/2017 ASCUS HPV positive repeat in 1 year  CBB  Given handout 01/03/18 Pelvis tested to 7lbs 7oz  CS/VBAC N/A   Support Person                Gestational age appropriate obstetric precautions including but not limited to vaginal bleeding, contractions, leaking of fluid and fetal movement were reviewed in detail with the patient.   - postpartum contraception history of migraine with aura non-estrogen options discussed most interest in minipill. Risks and benefits of this pill is discussed with pt, as well as failure rates on and off breast feeding.  While effective at preventing pregnancy if taken consistently progestin only oral contraceptives do not prevent transmission of sexually transmitted diseases and use of barrier methods for this purpose was discussed.  - Given handout of CBB  Return in about 2 weeks (around 01/17/2018) for ROB.  Vena Austria, MD, [facility], [facility] 01/03/2018, 2:35 PM

## 2018-01-03 NOTE — Progress Notes (Signed)
ROB TDAP Given

## 2018-01-17 ENCOUNTER — Ambulatory Visit (INDEPENDENT_AMBULATORY_CARE_PROVIDER_SITE_OTHER): Payer: Medicaid Other | Admitting: Obstetrics and Gynecology

## 2018-01-17 VITALS — BP 100/62 | Wt 159.0 lb

## 2018-01-17 DIAGNOSIS — R87610 Atypical squamous cells of undetermined significance on cytologic smear of cervix (ASC-US): Secondary | ICD-10-CM

## 2018-01-17 DIAGNOSIS — R87810 Cervical high risk human papillomavirus (HPV) DNA test positive: Secondary | ICD-10-CM

## 2018-01-17 DIAGNOSIS — Z3480 Encounter for supervision of other normal pregnancy, unspecified trimester: Secondary | ICD-10-CM

## 2018-01-17 DIAGNOSIS — Z3A33 33 weeks gestation of pregnancy: Secondary | ICD-10-CM

## 2018-01-17 NOTE — Progress Notes (Signed)
ROB

## 2018-01-17 NOTE — Progress Notes (Signed)
    Routine Prenatal Care Visit  Subjective  Deanna Harris is a 22 y.o. [phone] at 399w6d being seen today for ongoing prenatal care.  She is currently monitored for the following issues for this low-risk pregnancy and has Migraine aura, persistent, intractable; Chronic pelvic pain in female; Atypical squamous cell changes of undetermined significance (ASCUS) on cervical cytology with positive high risk human papilloma virus (HPV); and Supervision of other normal pregnancy, antepartum on their problem list.  ----------------------------------------------------------------------------------- Patient reports no complaints.   Contractions: Irregular. Vag. Bleeding: None.  Movement: Present. Denies leaking of fluid.  ----------------------------------------------------------------------------------- The following portions of the patient's history were reviewed and updated as appropriate: allergies, current medications, past family history, past medical history, past social history, past surgical history and problem list. Problem list updated.   Objective  Blood pressure 100/62, weight 159 lb (72.1 kg), last menstrual period 05/06/2017. Pregravid weight 152 lb (68.9 kg) Total Weight Gain 7 lb (3.175 kg) Urinalysis:      Fetal Status: Fetal Heart Rate (bpm): 145 Fundal Height: 32 cm Movement: Present     General:  Alert, oriented and cooperative. Patient is in no acute distress.  Skin: Skin is warm and dry. No rash noted.   Cardiovascular: Normal heart rate noted  Respiratory: Normal respiratory effort, no problems with respiration noted  Abdomen: Soft, gravid, appropriate for gestational age. Pain/Pressure: Absent     Pelvic:  Cervical exam deferred        Extremities: Normal range of motion.     ental Status: Normal mood and affect. Normal behavior. Normal judgment and thought content.     Assessment   22 y.o. [id number] at 4899w6d by  03/01/2018, by Ultrasound presenting for routine  prenatal visit  Plan   pregnancy Problems (from 07/25/17 to present)    Problem Noted Resolved   Supervision of other normal pregnancy, antepartum 07/25/2017 by Conard NovakJackson, Stephen D, MD No   Overview Addendum 01/03/2018  2:34 PM by Vena AustriaStaebler, Shelsy Seng, MD    [facility] Prenatal Labs  Dating 10 wk u/s Blood type: AB/Positive/-- (10/18 1519)   Genetic Screen 1 Screen: neg        Antibody:Negative (10/18 1519)  Anatomic US NORMAL FEMALE Rubella: 1.38 (10/18 1519)  Varicella: immune  GTT 128 RPR: Non Reactive (10/18 1519)   Rhogam n/a HBsAg: Negative (10/18 1519)   TDaP vaccine 01/03/2018 HIV:   Neg  Baby Food Bottle                       GBS:   Contraception Minipill Pap: 04/03/2017 ASCUS HPV positive repeat in 1 year  CBB  Given handout 01/03/18 Pelvis tested to 7lbs 7oz  CS/VBAC N/A   Support Person                Gestational age appropriate obstetric precautions including but not limited to vaginal bleeding, contractions, leaking of fluid and fetal movement were reviewed in detail with the patient.    Return in about 2 weeks (around 01/31/2018) for ROB.  Vena AustriaAndreas Jaquarious Grey, MD, Evern CoreFACOG [facility], [facility] 01/17/2018, 2:30 PM

## 2018-01-31 ENCOUNTER — Ambulatory Visit (INDEPENDENT_AMBULATORY_CARE_PROVIDER_SITE_OTHER): Payer: Medicaid Other | Admitting: Advanced Practice Midwife

## 2018-01-31 ENCOUNTER — Encounter: Payer: Self-pay | Admitting: Advanced Practice Midwife

## 2018-01-31 VITALS — BP 100/60 | Wt 156.0 lb

## 2018-01-31 DIAGNOSIS — Z3A35 35 weeks gestation of pregnancy: Secondary | ICD-10-CM

## 2018-01-31 DIAGNOSIS — Z3480 Encounter for supervision of other normal pregnancy, unspecified trimester: Secondary | ICD-10-CM

## 2018-01-31 DIAGNOSIS — O26843 Uterine size-date discrepancy, third trimester: Secondary | ICD-10-CM

## 2018-01-31 DIAGNOSIS — Z3685 Encounter for antenatal screening for Streptococcus B: Secondary | ICD-10-CM

## 2018-01-31 DIAGNOSIS — Z113 Encounter for screening for infections with a predominantly sexual mode of transmission: Secondary | ICD-10-CM

## 2018-01-31 NOTE — Progress Notes (Signed)
C/o pelvic pressure/pain. Intermittent ctx since last visit.

## 2018-01-31 NOTE — Patient Instructions (Signed)
Vaginal Delivery Vaginal delivery means that you will give birth by pushing your baby out of your birth canal (vagina). A team of health care providers will help you before, during, and after vaginal delivery. Birth experiences are unique for every woman and every pregnancy, and birth experiences vary depending on where you choose to give birth. What should I do to prepare for my baby's birth? Before your baby is born, it is important to talk with your health care provider about:  Your labor and delivery preferences. These may include: ? Medicines that you may be given. ? How you will manage your pain. This might include non-medical pain relief techniques or injectable pain relief such as epidural analgesia. ? How you and your baby will be monitored during labor and delivery. ? Who may be in the labor and delivery room with you. ? Your feelings about surgical delivery of your baby (cesarean delivery, or C-section) if this becomes necessary. ? Your feelings about receiving donated blood through an IV tube (blood transfusion) if this becomes necessary.  Whether you are able: ? To take pictures or videos of the birth. ? To eat during labor and delivery. ? To move around, walk, or change positions during labor and delivery.  What to expect after your baby is born, such as: ? Whether delayed umbilical cord clamping and cutting is offered. ? Who will care for your baby right after birth. ? Medicines or tests that may be recommended for your baby. ? Whether breastfeeding is supported in your hospital or birth center. ? How long you will be in the hospital or birth center.  How any medical conditions you have may affect your baby or your labor and delivery experience.  To prepare for your baby's birth, you should also:  Attend all of your health care visits before delivery (prenatal visits) as recommended by your health care provider. This is important.  Prepare your home for your baby's  arrival. Make sure that you have: ? Diapers. ? Baby clothing. ? Feeding equipment. ? Safe sleeping arrangements for you and your baby.  Install a car seat in your vehicle. Have your car seat checked by a certified car seat installer to make sure that it is installed safely.  Think about who will help you with your new baby at home for at least the first several weeks after delivery.  What can I expect when I arrive at the birth center or hospital? Once you are in labor and have been admitted into the hospital or birth center, your health care provider may:  Review your pregnancy history and any concerns you have.  Insert an IV tube into one of your veins. This is used to give you fluids and medicines.  Check your blood pressure, pulse, temperature, and heart rate (vital signs).  Check whether your bag of water (amniotic sac) has broken (ruptured).  Talk with you about your birth plan and discuss pain control options.  Monitoring Your health care provider may monitor your contractions (uterine monitoring) and your baby's heart rate (fetal monitoring). You may need to be monitored:  Often, but not continuously (intermittently).  All the time or for long periods at a time (continuously). Continuous monitoring may be needed if: ? You are taking certain medicines, such as medicine to relieve pain or make your contractions stronger. ? You have pregnancy or labor complications.  Monitoring may be done by:  Placing a special stethoscope or a handheld monitoring device on your abdomen to   check your baby's heartbeat, and feeling your abdomen for contractions. This method of monitoring does not continuously record your baby's heartbeat or your contractions.  Placing monitors on your abdomen (external monitors) to record your baby's heartbeat and the frequency and length of contractions. You may not have to wear external monitors all the time.  Placing monitors inside of your uterus  (internal monitors) to record your baby's heartbeat and the frequency, length, and strength of your contractions. ? Your health care provider may use internal monitors if he or she needs more information about the strength of your contractions or your baby's heart rate. ? Internal monitors are put in place by passing a thin, flexible wire through your vagina and into your uterus. Depending on the type of monitor, it may remain in your uterus or on your baby's head until birth. ? Your health care provider will discuss the benefits and risks of internal monitoring with you and will ask for your permission before inserting the monitors.  Telemetry. This is a type of continuous monitoring that can be done with external or internal monitors. Instead of having to stay in bed, you are able to move around during telemetry. Ask your health care provider if telemetry is an option for you.  Physical exam Your health care provider may perform a physical exam. This may include:  Checking whether your baby is positioned: ? With the head toward your vagina (head-down). This is most common. ? With the head toward the top of your uterus (head-up or breech). If your baby is in a breech position, your health care provider may try to turn your baby to a head-down position so you can deliver vaginally. If it does not seem that your baby can be born vaginally, your provider may recommend surgery to deliver your baby. In rare cases, you may be able to deliver vaginally if your baby is head-up (breech delivery). ? Lying sideways (transverse). Babies that are lying sideways cannot be delivered vaginally.  Checking your cervix to determine: ? Whether it is thinning out (effacing). ? Whether it is opening up (dilating). ? How low your baby has moved into your birth canal.  What are the three stages of labor and delivery?  Normal labor and delivery is divided into the following three stages: Stage 1  Stage 1 is the  longest stage of labor, and it can last for hours or days. Stage 1 includes: ? Early labor. This is when contractions may be irregular, or regular and mild. Generally, early labor contractions are more than 10 minutes apart. ? Active labor. This is when contractions get longer, more regular, more frequent, and more intense. ? The transition phase. This is when contractions happen very close together, are very intense, and may last longer than during any other part of labor.  Contractions generally feel mild, infrequent, and irregular at first. They get stronger, more frequent (about every 2-3 minutes), and more regular as you progress from early labor through active labor and transition.  Many women progress through stage 1 naturally, but you may need help to continue making progress. If this happens, your health care provider may talk with you about: ? Rupturing your amniotic sac if it has not ruptured yet. ? Giving you medicine to help make your contractions stronger and more frequent.  Stage 1 ends when your cervix is completely dilated to 4 inches (10 cm) and completely effaced. This happens at the end of the transition phase. Stage 2  Once   your cervix is completely effaced and dilated to 4 inches (10 cm), you may start to feel an urge to push. It is common for the body to naturally take a rest before feeling the urge to push, especially if you received an epidural or certain other pain medicines. This rest period may last for up to 1-2 hours, depending on your unique labor experience.  During stage 2, contractions are generally less painful, because pushing helps relieve contraction pain. Instead of contraction pain, you may feel stretching and burning pain, especially when the widest part of your baby's head passes through the vaginal opening (crowning).  Your health care provider will closely monitor your pushing progress and your baby's progress through the vagina during stage 2.  Your  health care provider may massage the area of skin between your vaginal opening and anus (perineum) or apply warm compresses to your perineum. This helps it stretch as the baby's head starts to crown, which can help prevent perineal tearing. ? In some cases, an incision may be made in your perineum (episiotomy) to allow the baby to pass through the vaginal opening. An episiotomy helps to make the opening of the vagina larger to allow more room for the baby to fit through.  It is very important to breathe and focus so your health care provider can control the delivery of your baby's head. Your health care provider may have you decrease the intensity of your pushing, to help prevent perineal tearing.  After delivery of your baby's head, the shoulders and the rest of the body generally deliver very quickly and without difficulty.  Once your baby is delivered, the umbilical cord may be cut right away, or this may be delayed for 1-2 minutes, depending on your baby's health. This may vary among health care providers, hospitals, and birth centers.  If you and your baby are healthy enough, your baby may be placed on your chest or abdomen to help maintain the baby's temperature and to help you bond with each other. Some mothers and babies start breastfeeding at this time. Your health care team will dry your baby and help keep your baby warm during this time.  Your baby may need immediate care if he or she: ? Showed signs of distress during labor. ? Has a medical condition. ? Was born too early (prematurely). ? Had a bowel movement before birth (meconium). ? Shows signs of difficulty transitioning from being inside the uterus to being outside of the uterus. If you are planning to breastfeed, your health care team will help you begin a feeding. Stage 3  The third stage of labor starts immediately after the birth of your baby and ends after you deliver the placenta. The placenta is an organ that develops  during pregnancy to provide oxygen and nutrients to your baby in the womb.  Delivering the placenta may require some pushing, and you may have mild contractions. Breastfeeding can stimulate contractions to help you deliver the placenta.  After the placenta is delivered, your uterus should tighten (contract) and become firm. This helps to stop bleeding in your uterus. To help your uterus contract and to control bleeding, your health care provider may: ? Give you medicine by injection, through an IV tube, by mouth, or through your rectum (rectally). ? Massage your abdomen or perform a vaginal exam to remove any blood clots that are left in your uterus. ? Empty your bladder by placing a thin, flexible tube (catheter) into your bladder. ? Encourage   you to breastfeed your baby. After labor is over, you and your baby will be monitored closely to ensure that you are both healthy until you are ready to go home. Your health care team will teach you how to care for yourself and your baby. This information is not intended to replace advice given to you by your health care provider. Make sure you discuss any questions you have with your health care provider. Document Released: 07/03/2008 Document Revised: 04/13/2016 Document Reviewed: 10/09/2015 Elsevier Interactive Patient Education  2018 [employer]  

## 2018-01-31 NOTE — Progress Notes (Signed)
  Routine Prenatal Care Visit  Subjective  Deanna Harris is a 22 y.o. [phone] at 7057w6d being seen today for ongoing prenatal care.  She is currently monitored for the following issues for this low-risk pregnancy and has Migraine aura, persistent, intractable; Chronic pelvic pain in female; Atypical squamous cell changes of undetermined significance (ASCUS) on cervical cytology with positive high risk human papilloma virus (HPV); and Supervision of other normal pregnancy, antepartum on their problem list.  ----------------------------------------------------------------------------------- Patient reports sharp pelvic pains and some irregular contractions each day. She is concerned about weight gain and has lost 3 pounds since last visit. She is encouraged to eat calorie dense foods- 3 meals a day with snacks in between. .   Contractions: Irregular. Vag. Bleeding: None.  Movement: Present. Denies leaking of fluid.  ----------------------------------------------------------------------------------- The following portions of the patient's history were reviewed and updated as appropriate: allergies, current medications, past family history, past medical history, past social history, past surgical history and problem list. Problem list updated.   Objective  Blood pressure 100/60, weight 156 lb (70.8 kg), last menstrual period 05/06/2017. Pregravid weight 152 lb (68.9 kg) Total Weight Gain 4 lb (1.814 kg) Urinalysis:      Fetal Status: Fetal Heart Rate (bpm): 132 Fundal Height: 34 cm Movement: Present     General:  Alert, oriented and cooperative. Patient is in no acute distress.  Skin: Skin is warm and dry. No rash noted.   Cardiovascular: Normal heart rate noted  Respiratory: Normal respiratory effort, no problems with respiration noted  Abdomen: Soft, gravid, appropriate for gestational age. Pain/Pressure: Present     Pelvic:  Cervical exam performed Dilation: 1 Effacement (%): 40 Station:  -3 GBS and aptima collected  Extremities: Normal range of motion.  Edema: None  Mental Status: Normal mood and affect. Normal behavior. Normal judgment and thought content.   Assessment   22 y.o. [id number] at 4957w6d by  03/01/2018, by Ultrasound presenting for routine prenatal visit  Plan   pregnancy Problems (from 07/25/17 to present)    Problem Noted Resolved   Supervision of other normal pregnancy, antepartum 07/25/2017 by Conard NovakJackson, Stephen D, MD No   Overview Addendum 01/03/2018  2:34 PM by Vena AustriaStaebler, Andreas, MD    [facility] Prenatal Labs  Dating 10 wk u/s Blood type: AB/Positive/-- (10/18 1519)   Genetic Screen 1 Screen: neg        Antibody:Negative (10/18 1519)  Anatomic US NORMAL FEMALE Rubella: 1.38 (10/18 1519)  Varicella: immune  GTT 128 RPR: Non Reactive (10/18 1519)   Rhogam n/a HBsAg: Negative (10/18 1519)   TDaP vaccine 01/03/2018 HIV:   Neg  Baby Food Bottle                       GBS:   Contraception Minipill Pap: 04/03/2017 ASCUS HPV positive repeat in 1 year  CBB  Given handout 01/03/18 Pelvis tested to 7lbs 7oz  CS/VBAC N/A   Support Person                Preterm labor symptoms and general obstetric precautions including but not limited to vaginal bleeding, contractions, leaking of fluid and fetal movement were reviewed in detail with the patient. Please refer to After Visit Summary for other counseling recommendations.  Eat calorie dense/healthy foods- 3 meals per day and snacks in between  Return in about 1 week (around 02/07/2018) for growth scan and rob.  Tresea MallJane Aulton Routt, CNM 01/31/2018 8:50 AM

## 2018-02-02 LAB — STREP GP B NAA: STREP GROUP B AG: NEGATIVE

## 2018-02-04 LAB — CHLAMYDIA/GONOCOCCUS/TRICHOMONAS, NAA
Chlamydia by NAA: NEGATIVE
Gonococcus by NAA: NEGATIVE
Trich vag by NAA: NEGATIVE

## 2018-02-05 ENCOUNTER — Encounter: Payer: Self-pay | Admitting: Advanced Practice Midwife

## 2018-02-05 ENCOUNTER — Ambulatory Visit (INDEPENDENT_AMBULATORY_CARE_PROVIDER_SITE_OTHER): Payer: Medicaid Other | Admitting: Advanced Practice Midwife

## 2018-02-05 ENCOUNTER — Ambulatory Visit (INDEPENDENT_AMBULATORY_CARE_PROVIDER_SITE_OTHER): Payer: Medicaid Other

## 2018-02-05 VITALS — BP 100/60 | Wt 155.0 lb

## 2018-02-05 DIAGNOSIS — O26843 Uterine size-date discrepancy, third trimester: Secondary | ICD-10-CM

## 2018-02-05 DIAGNOSIS — Z3685 Encounter for antenatal screening for Streptococcus B: Secondary | ICD-10-CM

## 2018-02-05 DIAGNOSIS — Z3A36 36 weeks gestation of pregnancy: Secondary | ICD-10-CM

## 2018-02-05 DIAGNOSIS — Z113 Encounter for screening for infections with a predominantly sexual mode of transmission: Secondary | ICD-10-CM

## 2018-02-05 DIAGNOSIS — Z3480 Encounter for supervision of other normal pregnancy, unspecified trimester: Secondary | ICD-10-CM

## 2018-02-05 NOTE — Progress Notes (Signed)
  Routine Prenatal Care Visit  Subjective  Deanna Harris is a 22 y.o. [phone] at [redacted]w[redacted]d being seen today for ongoing prenatal care.  She is currently monitored for the following issues for this low-risk pregnancy and has Migraine aura, persistent, intractable; Chronic pelvic pain in female; Atypical squamous cell changes of undetermined significance (ASCUS) on cervical cytology with positive high risk human papilloma virus (HPV); and Supervision of other normal pregnancy, antepartum on their problem list.  ----------------------------------------------------------------------------------- Patient reports she has a good appetite and is eating well. She denies vomiting. .   Contractions: Irregular. Vag. Bleeding: None.  Movement: Present. Denies leaking of fluid.  ----------------------------------------------------------------------------------- The following portions of the patient's history were reviewed and updated as appropriate: allergies, current medications, past family history, past medical history, past social history, past surgical history and problem list. Problem list updated.   Objective  Blood pressure 100/60, weight 155 lb (70.3 kg), last menstrual period 05/06/2017. Pregravid weight 152 lb (68.9 kg) Total Weight Gain 3 lb (1.361 kg) Urinalysis: Urine Protein: Negative Urine Glucose: Negative  Fetal Status: Fetal Heart Rate (bpm): 141 Fundal Height: 35 cm Movement: Present     Growth scan today: 25.4%, 5 pounds 13 ounces, AFI 9.11 cm, cephalic  General:  Alert, oriented and cooperative. Patient is in no acute distress.  Skin: Skin is warm and dry. No rash noted.   Cardiovascular: Normal heart rate noted  Respiratory: Normal respiratory effort, no problems with respiration noted  Abdomen: Soft, gravid, appropriate for gestational age. Pain/Pressure: Absent     Pelvic:  Cervical exam deferred        Extremities: Normal range of motion.  Edema: None  Mental Status: Normal  mood and affect. Normal behavior. Normal judgment and thought content.   Assessment   22 y.o. [id number] at [redacted]w[redacted]d by  03/01/2018, by Ultrasound presenting for routine prenatal visit  Plan   pregnancy Problems (from 07/25/17 to present)    Problem Noted Resolved   Supervision of other normal pregnancy, antepartum 07/25/2017 by Conard Novak, MD No   Overview Addendum 01/03/2018  2:34 PM by Vena Austria, MD    [facility] Prenatal Labs  Dating 10 wk u/s Blood type: AB/Positive/-- (10/18 1519)   Genetic Screen 1 Screen: neg        Antibody:Negative (10/18 1519)  Anatomic [place] NORMAL FEMALE Rubella: 1.38 (10/18 1519)  Varicella: immune  GTT 128 RPR: Non Reactive (10/18 1519)   Rhogam n/a HBsAg: Negative (10/18 1519)   TDaP vaccine 01/03/2018 HIV:   Neg  Baby Food Bottle                       GBS:   Contraception Minipill Pap: 04/03/2017 ASCUS HPV positive repeat in 1 year  CBB  Given handout 01/03/18 Pelvis tested to 7lbs 7oz  CS/VBAC N/A   Support Person                Preterm labor symptoms and general obstetric precautions including but not limited to vaginal bleeding, contractions, leaking of fluid and fetal movement were reviewed in detail with the patient.    Return in about 1 week (around 02/12/2018) for rob.  Tresea Mall, CNM 02/05/2018 3:19 PM  .

## 2018-02-07 ENCOUNTER — Encounter: Payer: Medicaid Other | Admitting: Maternal Newborn

## 2018-02-07 ENCOUNTER — Other Ambulatory Visit: Payer: Medicaid Other

## 2018-02-13 ENCOUNTER — Ambulatory Visit (INDEPENDENT_AMBULATORY_CARE_PROVIDER_SITE_OTHER): Payer: Medicaid Other | Admitting: Maternal Newborn

## 2018-02-13 ENCOUNTER — Encounter: Payer: Self-pay | Admitting: Maternal Newborn

## 2018-02-13 VITALS — BP 104/60 | Wt 161.0 lb

## 2018-02-13 DIAGNOSIS — Z3A37 37 weeks gestation of pregnancy: Secondary | ICD-10-CM

## 2018-02-13 DIAGNOSIS — Z3480 Encounter for supervision of other normal pregnancy, unspecified trimester: Secondary | ICD-10-CM

## 2018-02-13 NOTE — Progress Notes (Signed)
ROB Leg cramps

## 2018-02-13 NOTE — Progress Notes (Signed)
    Routine Prenatal Care Visit  Subjective  Deanna Harris is a 22 y.o. [phone] at [redacted]w[redacted]d being seen today for ongoing prenatal care.  She is currently monitored for the following issues for this low-risk pregnancy and has Migraine aura, persistent, intractable; Chronic pelvic pain in female; Atypical squamous cell changes of undetermined significance (ASCUS) on cervical cytology with positive high risk human papilloma virus (HPV); and Supervision of other normal pregnancy, antepartum on their problem list.  ----------------------------------------------------------------------------------- Patient reports occasional contractions. Also has leg cramps, worse at night. Taking magnesium supplement. Contractions: Irregular. Vag. Bleeding: None.  Movement: Present. Denies leaking of fluid.  ----------------------------------------------------------------------------------- The following portions of the patient's history were reviewed and updated as appropriate: allergies, current medications, past family history, past medical history, past social history, past surgical history and problem list. Problem list updated.   Objective  Blood pressure 104/60, weight 161 lb (73 kg), last menstrual period 05/06/2017. Pregravid weight 152 lb (68.9 kg) Total Weight Gain 9 lb (4.082 kg) Urinalysis: Urine Protein: Negative Urine Glucose: Negative  Fetal Status: Fetal Heart Rate (bpm): 138 Fundal Height: 36 cm Movement: Present  Presentation: Vertex  General:  Alert, oriented and cooperative. Patient is in no acute distress.  Skin: Skin is warm and dry. No rash noted.   Cardiovascular: Normal heart rate noted  Respiratory: Normal respiratory effort, no problems with respiration noted  Abdomen: Soft, gravid, appropriate for gestational age. Pain/Pressure: Absent     Pelvic:  Cervical exam performed Dilation: 2 Effacement (%): 40 Station: -3  Extremities: Normal range of motion.     Mental Status: Normal  mood and affect. Normal behavior. Normal judgment and thought content.     Assessment   22 y.o. [id number] at [redacted]w[redacted]d, EDD 03/01/2018 by Ultrasound presenting for routine prenatal visit.  Plan   pregnancy Problems (from 07/25/17 to present)    Problem Noted Resolved   Supervision of other normal pregnancy, antepartum 07/25/2017 by Conard Novak, MD No   Overview Addendum 01/03/2018  2:34 PM by Vena Austria, MD    [facility] Prenatal Labs  Dating 10 wk u/s Blood type: AB/Positive/-- (10/18 1519)   Genetic Screen 1 Screen: neg        Antibody:Negative (10/18 1519)  Anatomic [place] NORMAL FEMALE Rubella: 1.38 (10/18 1519)  Varicella: immune  GTT 128 RPR: Non Reactive (10/18 1519)   Rhogam n/a HBsAg: Negative (10/18 1519)   TDaP vaccine 01/03/2018 HIV:   Neg  Baby Food Bottle                       GBS:   Contraception [place] Pap: 04/03/2017 ASCUS HPV positive repeat in 1 year  CBB  Given handout 01/03/18 Pelvis tested to 7lbs 7oz  CS/VBAC N/A   Support Person             We discussed expectant management vs. induction of labor, and she desires induction close to 40 weeks. Will check for available times and schedule.   Term labor symptoms and general obstetric precautions were reviewed.  Return in about 1 week (around 02/20/2018) for ROB.  Marcelyn Bruins, CNM 02/13/2018  4:07 PM

## 2018-02-17 ENCOUNTER — Telehealth: Payer: Self-pay

## 2018-02-17 ENCOUNTER — Inpatient Hospital Stay: Payer: Medicaid Other | Admitting: Anesthesiology

## 2018-02-17 ENCOUNTER — Inpatient Hospital Stay
Admission: EM | Admit: 2018-02-17 | Discharge: 2018-02-19 | DRG: 807 | Disposition: A | Discharge status: Home / Self Care | Payer: Medicaid Other | Attending: Advanced Practice Midwife | Admitting: Advanced Practice Midwife

## 2018-02-17 ENCOUNTER — Other Ambulatory Visit: Payer: Self-pay

## 2018-02-17 DIAGNOSIS — Z3483 Encounter for supervision of other normal pregnancy, third trimester: Secondary | ICD-10-CM | POA: Diagnosis present

## 2018-02-17 DIAGNOSIS — Z3A38 38 weeks gestation of pregnancy: Secondary | ICD-10-CM | POA: Diagnosis not present

## 2018-02-17 LAB — CBC
HEMATOCRIT: 33.7 % — AB (ref 35.0–47.0)
Hemoglobin: 11.9 g/dL — ABNORMAL LOW (ref 12.0–16.0)
MCH: 30.4 pg (ref 26.0–34.0)
MCHC: 35.5 g/dL (ref 32.0–36.0)
MCV: 85.7 fL (ref 80.0–100.0)
PLATELETS: 171 10*3/uL (ref 150–440)
RBC: 3.93 MIL/uL (ref 3.80–5.20)
RDW: 13.3 % (ref 11.5–14.5)
WBC: 8.1 10*3/uL (ref 3.6–11.0)

## 2018-02-17 LAB — TYPE AND SCREEN
ABO/RH(D): AB POS
ANTIBODY SCREEN: NEGATIVE

## 2018-02-17 MED ORDER — ONDANSETRON HCL 4 MG/2ML IJ SOLN
INTRAMUSCULAR | Status: AC
  Administered 2018-02-17: 4 mg via INTRAVENOUS
  Filled 2018-02-17: qty 2

## 2018-02-17 MED ORDER — OXYTOCIN BOLUS FROM INFUSION
500.0000 mL | Freq: Once | INTRAVENOUS | Status: AC
  Administered 2018-02-18: 500 mL via INTRAVENOUS

## 2018-02-17 MED ORDER — ONDANSETRON HCL 4 MG/2ML IJ SOLN: 4.0000 mg | Freq: Four times a day (QID) | INTRAMUSCULAR | Status: DC | PRN

## 2018-02-17 MED ORDER — DIPHENHYDRAMINE HCL 25 MG PO CAPS
25.0000 mg | ORAL_CAPSULE | Freq: Four times a day (QID) | ORAL | Status: DC | PRN
  Administered 2018-02-17: 25 mg via ORAL

## 2018-02-17 MED ORDER — EPHEDRINE 5 MG/ML INJ
10.0000 mg | INTRAVENOUS | Status: DC | PRN
  Administered 2018-02-18: 5 mg via INTRAVENOUS
  Filled 2018-02-17: qty 2
  Filled 2018-02-17: qty 4

## 2018-02-17 MED ORDER — ACETAMINOPHEN 325 MG PO TABS
650.0000 mg | ORAL_TABLET | Freq: Four times a day (QID) | ORAL | Status: DC | PRN
  Administered 2018-02-17 – 2018-02-18 (×2): 650 mg via ORAL
  Filled 2018-02-17: qty 2

## 2018-02-17 MED ORDER — EPHEDRINE 5 MG/ML INJ
10.0000 mg | INTRAVENOUS | Status: DC | PRN
  Filled 2018-02-17: qty 2

## 2018-02-17 MED ORDER — LACTATED RINGERS IV SOLN
500.0000 mL | INTRAVENOUS | Status: DC | PRN
  Administered 2018-02-17: 500 mL via INTRAVENOUS

## 2018-02-17 MED ORDER — FENTANYL 2.5 MCG/ML W/ROPIVACAINE 0.15% IN NS 100 ML EPIDURAL (ARMC)
12.0000 mL/h | EPIDURAL | Status: DC
  Administered 2018-02-18: 12 mL/h via EPIDURAL

## 2018-02-17 MED ORDER — LACTATED RINGERS IV SOLN
500.0000 mL | Freq: Once | INTRAVENOUS | Status: AC
  Administered 2018-02-18: 500 mL via INTRAVENOUS

## 2018-02-17 MED ORDER — OXYTOCIN 10 UNIT/ML IJ SOLN
INTRAMUSCULAR | Status: AC
  Filled 2018-02-17: qty 2

## 2018-02-17 MED ORDER — ONDANSETRON HCL 4 MG/2ML IJ SOLN
4.0000 mg | Freq: Once | INTRAMUSCULAR | Status: AC
  Administered 2018-02-17: 4 mg via INTRAVENOUS

## 2018-02-17 MED ORDER — LACTATED RINGERS IV BOLUS
500.0000 mL | Freq: Once | INTRAVENOUS | Status: AC
  Administered 2018-02-17: 500 mL via INTRAVENOUS

## 2018-02-17 MED ORDER — MISOPROSTOL 200 MCG PO TABS
ORAL_TABLET | ORAL | Status: AC
  Filled 2018-02-17: qty 4

## 2018-02-17 MED ORDER — LIDOCAINE HCL (PF) 1 % IJ SOLN
INTRAMUSCULAR | Status: AC
  Filled 2018-02-17: qty 30

## 2018-02-17 MED ORDER — LACTATED RINGERS IV SOLN
INTRAVENOUS | Status: DC
  Administered 2018-02-18: 12:00:00 via INTRAVENOUS

## 2018-02-17 MED ORDER — DIPHENHYDRAMINE HCL 25 MG PO CAPS
ORAL_CAPSULE | ORAL | Status: AC
  Administered 2018-02-17: 25 mg via ORAL
  Filled 2018-02-17: qty 1

## 2018-02-17 MED ORDER — PHENYLEPHRINE 40 MCG/ML (10ML) SYRINGE FOR IV PUSH (FOR BLOOD PRESSURE SUPPORT)
80.0000 ug | PREFILLED_SYRINGE | INTRAVENOUS | Status: DC | PRN
  Filled 2018-02-17: qty 5

## 2018-02-17 MED ORDER — FENTANYL 2.5 MCG/ML W/ROPIVACAINE 0.15% IN NS 100 ML EPIDURAL (ARMC)
EPIDURAL | Status: AC
  Filled 2018-02-17: qty 100

## 2018-02-17 MED ORDER — AMMONIA AROMATIC IN INHA
RESPIRATORY_TRACT | Status: AC
  Filled 2018-02-17: qty 10

## 2018-02-17 MED ORDER — LIDOCAINE HCL (PF) 1 % IJ SOLN
30.0000 mL | INTRAMUSCULAR | Status: AC | PRN
  Administered 2018-02-18: 3 mL via SUBCUTANEOUS

## 2018-02-17 MED ORDER — OXYTOCIN 40 UNITS IN LACTATED RINGERS INFUSION - SIMPLE MED
2.5000 [IU]/h | INTRAVENOUS | Status: DC
  Filled 2018-02-17: qty 1000

## 2018-02-17 MED ORDER — DIPHENHYDRAMINE HCL 50 MG/ML IJ SOLN: 12.5000 mg | INTRAMUSCULAR | Status: DC | PRN

## 2018-02-17 MED ORDER — ACETAMINOPHEN 325 MG PO TABS
ORAL_TABLET | ORAL | Status: AC
  Administered 2018-02-17: 650 mg via ORAL
  Filled 2018-02-17: qty 2

## 2018-02-17 NOTE — Telephone Encounter (Signed)
Pt called reporting contractions every 3 min's, also the pain is so bad she can not walk and feels sick .I advised her to go to L&D

## 2018-02-17 NOTE — Progress Notes (Signed)
     22 y.o. [id number] @ [redacted]w[redacted]d , admitted for  Pregnancy, observation for contractions, fetal heart rate deceleration  Subjective:  Patient states contractions are feeling stronger. She is having a difficult time getting comfortable  Objective:  BP 112/70 (BP Location: Left Arm)   Pulse 73   Temp 98.3 F (36.8 C) (Oral)   Resp 16   Ht  (1.6 m)   Wt 161 lb (73 kg)   LMP 05/06/2017   BMI 28.52 kg/m  Abd: contractions moderate to palpation Extr: no edema SVE: CERVIX: 2.5 cm dilated, 50 effaced, -1 station per RN exam  EFM: FHR: 135 bpm, variability: moderate,  accelerations:  Present,  decelerations:  Absent Toco: Frequency: Every 2-4 minutes Labs:   Results for OMAR, ORREGO (MRN [id number]) as of 02/17/2018 21:54  Ref. Range 02/17/2018 18:09  WBC Latest Ref Range: 3.6 - 11.0 K/uL 8.1  RBC Latest Ref Range: 3.80 - 5.20 MIL/uL 3.93  Hemoglobin Latest Ref Range: 12.0 - 16.0 g/dL 14.7 (L)  HCT Latest Ref Range: 35.0 - 47.0 % 33.7 (L)  MCV Latest Ref Range: 80.0 - 100.0 fL 85.7  MCH Latest Ref Range: 26.0 - 34.0 pg 30.4  MCHC Latest Ref Range: 32.0 - 36.0 g/dL 82.9  RDW Latest Ref Range: 11.5 - 14.5 % 13.3  Platelets Latest Ref Range: 150 - 440 K/uL 171  Sample Expiration Unknown 02/20/2018...  Antibody Screen Unknown NEG  ABO/RH(D) Unknown AB POS     Assessment & Plan:  [id number] @ [redacted]w[redacted]d, admitted for  Pregnancy and Labor/Delivery Management  1. Pain management: tylenol with benadryl for pain/sleep. 2. FWB: FHT category I.  3. ID: GBS negative 4. Continue observation overnight for cervical change/fetal well being  All discussed with patient, see orders   Tresea Mall, CNM [facility], [facility] 02/17/2018  9:50 PM

## 2018-02-17 NOTE — H&P (Addendum)
OB History & Physical   History of Present Illness:  Chief Complaint: contractions  HPI:  Deanna Harris is a 22 y.o. [phone] female at [redacted]w[redacted]d dated by ultrasound.  Her pregnancy has been complicated by migraine, ASCUS with positive HPV on cervical cytology, chronic pelvic pain.    She reports contractions every 3-4 minutes that began earlier in the day. She experienced a 17 minute long cramp in her right side at 5:35 this evening. She was nauseated from the pain.  She denies leakage of fluid.   She denies vaginal bleeding.   She reports fetal movement.    Maternal Medical History:   Past Medical History:  Diagnosis Date  . Asthma   . Migraine headache with aura   . Ovarian cyst 09/2013   left side    Past Surgical History:  Procedure Laterality Date  . CHOLECYSTECTOMY  04/24/2011  . DILATION AND CURETTAGE OF UTERUS     for RPOC after elective AB    Allergies  Allergen Reactions  . Tramadol Hives    Prior to Admission medications   Medication Sig Start Date End Date Taking? Authorizing Provider  Prenatal Multivit-Min-Fe-FA (PRENATAL VITAMINS) 0.8 MG tablet Take by mouth.    [provider]    OB History  Gravida Para Term Preterm AB Living  SAB TAB Ectopic Multiple Live Births    1     2    # Outcome Date GA Lbr Len/2nd Weight Sex Delivery Anes PTL Lv  4 Current           3 Term 04/27/13 [redacted]w[redacted]d  7 lb 5 oz (3.317 kg) M Vag-Spont   LIV  2 TAB 02/21/12          1 Term 04/08/11 [redacted]w[redacted]d  7 lb 7 oz (3.374 kg) M Vag-Spont   LIV    Prenatal care site: [facility]  Social History: She  reports that she has never smoked. She has never used smokeless tobacco. She reports that she does not drink alcohol or use drugs.  Family History: family history includes Diabetes in her mother; Heart attack in her maternal grandfather; Lung cancer in her maternal grandmother; Pancreatic cancer in her other.    Review of Systems: Negative x 10 systems reviewed  except as noted in the HPI.    Physical Exam:  Vital Signs: BP 116/70 (BP Location: Left Arm)   Pulse 72   Temp 98.4 F (36.9 C) (Oral)   Resp 16   Ht  (1.6 m)   Wt 161 lb (73 kg)   LMP 05/06/2017   BMI 28.52 kg/m  Constitutional: Well nourished, well developed female in no acute distress.  HEENT: normal Skin: Warm and dry.  Cardiovascular: Regular rate and rhythm.   Extremity: no edema  Respiratory: Clear to auscultation bilateral. Normal respiratory effort Abdomen: gravid, FHT present Back: no CVAT Neuro: DTRs 2+, Cranial nerves grossly intact Psych: Alert and Oriented x3. No memory deficits. Normal mood and affect.  MS: normal gait, normal bilateral lower extremity ROM/strength/stability.  Pelvic exam:  is not limited by body habitus EGBUS: within normal limits Vagina: within normal limits and with normal mucosa  Cervix: 2.5/50/-1   Pertinent Results:  Prenatal Labs: Blood type/Rh AB positive  Antibody screen negative  Rubella Immune  Varicella Immune    RPR Non-reactive  HBsAg negative  HIV negative  GC negative  Chlamydia negative  Genetic screening 1st trimester negative  1 hour GTT 128  3 hour GTT NA  GBS negative on 4/26   Baseline FHR: 145 beats/min   Variability: moderate   Accelerations: present   Decelerations: present with drop to 80's for several minutes. FHR briefly dropping to 90s with following contractions. Position changes, O2 on mom, IV started with bolus of LR infusing. FHR stabilized to 140s/150s.  Contractions: present frequency: every 4-6 minutes, mild to moderate to palpation Overall assessment: Category II, stable for now   Assessment:  Deanna Harris is a 22 y.o. [phone] female at [redacted]w[redacted]d with contractions, fetal decelerations.   Plan:  1. Continue in observation  2. CBC, T&S, Clrs, IVF 3. Bolus lactated ringers  4. Fetal well-being: Category II, stable 5. Terbutaline for non-reassuring FHR secondary to  contractions  Tresea Mall, [facility] 02/17/2018 6:23 PM

## 2018-02-17 NOTE — OB Triage Note (Signed)
Patient arrived on unit via wheelchair from ED with complaints of a 17 minute contraction at 1535.  Provided with gown and oriented to room and plan of care.

## 2018-02-17 NOTE — Progress Notes (Signed)
Cervical exam per RN: patient is now 4.5 cm and she is admitted for labor. She is requesting epidural.  Tresea Mall, CNM

## 2018-02-17 NOTE — Anesthesia Preprocedure Evaluation (Signed)

## 2018-02-18 ENCOUNTER — Encounter: Payer: Medicaid Other | Admitting: Obstetrics and Gynecology

## 2018-02-18 ENCOUNTER — Encounter: Payer: Self-pay | Admitting: *Deleted

## 2018-02-18 DIAGNOSIS — Z3A38 38 weeks gestation of pregnancy: Secondary | ICD-10-CM

## 2018-02-18 MED ORDER — COCONUT OIL OIL: 1.0000 "application " | TOPICAL_OIL | Status: DC | PRN

## 2018-02-18 MED ORDER — BENZOCAINE-MENTHOL 20-0.5 % EX AERO
1.0000 "application " | INHALATION_SPRAY | CUTANEOUS | Status: DC | PRN
  Administered 2018-02-18: 1 via TOPICAL
  Filled 2018-02-18: qty 56

## 2018-02-18 MED ORDER — BUPIVACAINE HCL (PF) 0.25 % IJ SOLN
INTRAMUSCULAR | Status: DC | PRN
  Administered 2018-02-18 (×2): 4 mL via EPIDURAL

## 2018-02-18 MED ORDER — SIMETHICONE 80 MG PO CHEW: 80.0000 mg | CHEWABLE_TABLET | ORAL | Status: DC | PRN

## 2018-02-18 MED ORDER — WITCH HAZEL-GLYCERIN EX PADS: 1.0000 "application " | MEDICATED_PAD | CUTANEOUS | Status: DC | PRN

## 2018-02-18 MED ORDER — DIBUCAINE 1 % RE OINT: 1.0000 "application " | TOPICAL_OINTMENT | RECTAL | Status: DC | PRN

## 2018-02-18 MED ORDER — ONDANSETRON HCL 4 MG PO TABS: 4.0000 mg | ORAL_TABLET | ORAL | Status: DC | PRN

## 2018-02-18 MED ORDER — IBUPROFEN 600 MG PO TABS
600.0000 mg | ORAL_TABLET | Freq: Four times a day (QID) | ORAL | Status: DC
  Administered 2018-02-18 – 2018-02-19 (×5): 600 mg via ORAL
  Filled 2018-02-18 (×5): qty 1

## 2018-02-18 MED ORDER — LIDOCAINE-EPINEPHRINE (PF) 1.5 %-1:200000 IJ SOLN
INTRAMUSCULAR | Status: DC | PRN
  Administered 2018-02-18: 4 mL via EPIDURAL

## 2018-02-18 MED ORDER — PRENATAL MULTIVITAMIN CH
1.0000 | ORAL_TABLET | Freq: Every day | ORAL | Status: DC
  Administered 2018-02-18 – 2018-02-19 (×2): 1 via ORAL
  Filled 2018-02-18 (×3): qty 1

## 2018-02-18 MED ORDER — SENNOSIDES-DOCUSATE SODIUM 8.6-50 MG PO TABS
2.0000 | ORAL_TABLET | ORAL | Status: DC
  Administered 2018-02-19: 2 via ORAL
  Filled 2018-02-18 (×2): qty 2

## 2018-02-18 MED ORDER — ONDANSETRON HCL 4 MG/2ML IJ SOLN: 4.0000 mg | INTRAMUSCULAR | Status: DC | PRN

## 2018-02-18 MED ORDER — OXYCODONE HCL 5 MG PO TABS
10.0000 mg | ORAL_TABLET | ORAL | Status: DC | PRN
  Administered 2018-02-18 – 2018-02-19 (×5): 10 mg via ORAL
  Filled 2018-02-18 (×5): qty 2

## 2018-02-18 NOTE — Anesthesia Procedure Notes (Signed)
Epidural Patient location during procedure: OB  Staffing Performed: anesthesiologist   Preanesthetic Checklist Completed: patient identified, site marked, surgical consent, pre-op evaluation, timeout performed, IV checked, risks and benefits discussed and monitors and equipment checked  Epidural Patient position: sitting Prep: Betadine Patient monitoring: heart rate, continuous pulse ox and blood pressure Approach: midline Location: L4-L5 Injection technique: LOR saline  Needle:  Needle type: Tuohy  Needle gauge: 17 G Needle length: 9 cm and 9 Needle insertion depth: 5 cm Catheter type: closed end flexible Catheter size: 19 Gauge Catheter at skin depth: 12 cm Test dose: negative and 1.5% lidocaine with Epi 1:200 K  Assessment Sensory level: T10 Events: blood not aspirated, injection not painful, no injection resistance, negative IV test and no paresthesia  Additional Notes   Patient tolerated the insertion well without complications.-SATD -IVTD. No paresthesia. Refer to [facility] nursing for VS and dosingReason for block:procedure for pain     

## 2018-02-18 NOTE — Discharge Summary (Addendum)
OB Discharge Summary     Patient Name: Deanna Harris DOB: 05/16/1996 MRN: [id number]  Date of admission: 02/17/2018 Delivering MD: Tresea Mall, CNM  Date of Delivery: 02/18/2018  Date of discharge: 02/19/2018  Admitting diagnosis: [redacted] wks pregnant Intrauterine pregnancy: [redacted]w[redacted]d     Secondary diagnosis: None     Discharge diagnosis: Term Pregnancy Delivered                                                                                                Post partum procedures: none  Augmentation: none  Complications: None  Hospital course:  Onset of Labor With Vaginal Delivery     22 y.o. yo [id number] at [redacted]w[redacted]d was admitted in active Labor on 02/17/2018. Patient had an uncomplicated labor course as follows:  Membrane Rupture Time/Date: 4:17 AM ,02/18/2018   Patient had delivery of viable female 4:32 AM, 02/18/2018 Details of delivery can be found in a separate delivery note  Patient had an uncomplicated postpartum course. She is ambulating and voiding without difficulty.  She is tolerating PO intake and her pain is controlled with PO medication.  She is discharged home in stable condition on 02/19/2018.  Physical exam  Vitals:   02/18/18 1604 02/18/18 1931 02/18/18 2337 02/19/18 0808  BP: 102/62 103/72 104/73 98/73  Pulse: 70 77 63 67  Resp: Temp: 98.1 F (36.7 C) 97.9 F (36.6 C) 97.9 F (36.6 C) 98.4 F (36.9 C)  TempSrc: Oral Oral Oral Oral  SpO2: 100% 98% 99% 99%  Weight:      Height:       General: alert, cooperative and no distress Lochia: appropriate Uterine Fundus: firm Incision: N/A DVT Evaluation: No evidence of DVT seen on physical exam.  Labs: Lab Results  Component Value Date   WBC 6.1 02/19/2018   HGB 9.8 (L) 02/19/2018   HCT 28.2 (L) 02/19/2018   MCV 87.0 02/19/2018   PLT 134 (L) 02/19/2018    Discharge instruction: per After Visit Summary.  Medications:  Allergies as of 02/19/2018      Reactions   Tramadol Hives       Medication List    TAKE these medications   FUSION 65-65-25-30 MG Caps Take 1 tablet by mouth daily.   ibuprofen 600 MG tablet Commonly known as:  ADVIL,MOTRIN Take 1 tablet (600 mg total) by mouth every 6 (six) hours as needed.   norethindrone 0.35 MG tablet Commonly known as:  MICRONOR,CAMILA,ERRIN Take 1 tablet (0.35 mg total) by mouth daily. Start taking on:  03/16/2018   oxyCODONE 5 MG immediate release tablet Commonly known as:  ROXICODONE Take 1 tablet (5 mg total) by mouth every 6 (six) hours as needed for up to 5 days for moderate pain or severe pain.   Prenatal Vitamins 0.8 MG tablet Take by mouth.       Diet: routine diet  Activity: Advance as tolerated. Pelvic rest for 6 weeks.   Outpatient follow up: Follow-up Information    Tresea Mall, CNM. Schedule an appointment as soon as possible for a visit in 4 week(s).  Specialty:  Obstetrics Contact information: [address] [place] [address] [phone]             Postpartum contraception: Progesterone only pills Rhogam Given postpartum: NA Rubella vaccine given postpartum: Rubella Immune Varicella vaccine given postpartum: Varicella Immune TDaP given antepartum or postpartum: given antepartum  Newborn Data: Live born female/  Birth Weight:  3220 g APGAR: 9, 9  Newborn Delivery   Birth date/time:  02/18/2018 04:32:00 Delivery type:  Vaginal, Spontaneous      Baby Feeding: Formula  Disposition:home with mother  SIGNED:  Farrel Conners, CNM 02/19/2018 12:02 PM

## 2018-02-19 LAB — CBC
HCT: 28.2 % — ABNORMAL LOW (ref 35.0–47.0)
Hemoglobin: 9.8 g/dL — ABNORMAL LOW (ref 12.0–16.0)
MCH: 30.3 pg (ref 26.0–34.0)
MCHC: 34.8 g/dL (ref 32.0–36.0)
MCV: 87 fL (ref 80.0–100.0)
Platelets: 134 10*3/uL — ABNORMAL LOW (ref 150–440)
RBC: 3.24 MIL/uL — AB (ref 3.80–5.20)
RDW: 13.5 % (ref 11.5–14.5)
WBC: 6.1 10*3/uL (ref 3.6–11.0)

## 2018-02-19 LAB — RPR: RPR: NONREACTIVE

## 2018-02-19 MED ORDER — FUSION 65-65-25-30 MG PO CAPS: 1.0000 | ORAL_CAPSULE | Freq: Every day | ORAL | 1 refills | Status: DC

## 2018-02-19 MED ORDER — IBUPROFEN 600 MG PO TABS: 600.0000 mg | ORAL_TABLET | Freq: Four times a day (QID) | ORAL | 1 refills | Status: DC | PRN

## 2018-02-19 MED ORDER — OXYCODONE HCL 5 MG PO TABS: 5.0000 mg | ORAL_TABLET | Freq: Four times a day (QID) | ORAL | 0 refills | Status: AC | PRN

## 2018-02-19 MED ORDER — NORETHINDRONE 0.35 MG PO TABS: 1.0000 | ORAL_TABLET | Freq: Every day | ORAL | 11 refills | Status: DC

## 2018-02-19 NOTE — Progress Notes (Signed)
Pt discharged with infant.  Discharge instructions, prescriptions and follow up appointment given to and reviewed with pt. Pt verbalized understanding. Escorted out by auxillary. 

## 2018-02-19 NOTE — Anesthesia Postprocedure Evaluation (Signed)
Anesthesia Post Note  Patient: Deanna Harris  Procedure(s) Performed: AN AD HOC LABOR EPIDURAL  Patient location during evaluation: Mother Baby Anesthesia Type: Epidural Level of consciousness: awake, awake and alert and oriented Pain management: pain level controlled Vital Signs Assessment: post-procedure vital signs reviewed and stable Respiratory status: spontaneous breathing, nonlabored ventilation and respiratory function stable Cardiovascular status: blood pressure returned to baseline and stable Postop Assessment: no headache and no backache Anesthetic complications: no     Last Vitals:  Vitals:   02/18/18 1931 02/18/18 2337  BP: 103/72 104/73  Pulse: 77 63  Resp: 18 18  Temp: 36.6 C 36.6 C  SpO2: 98% 99%    Last Pain:  Vitals:   02/19/18 0501  TempSrc:   PainSc: Asleep                 Ginger Carne

## 2018-02-20 ENCOUNTER — Encounter: Payer: Medicaid Other | Admitting: Advanced Practice Midwife

## 2018-02-23 ENCOUNTER — Encounter: Payer: Self-pay | Admitting: Certified Nurse Midwife

## 2018-02-23 ENCOUNTER — Other Ambulatory Visit: Payer: Self-pay | Admitting: Certified Nurse Midwife

## 2018-02-24 ENCOUNTER — Other Ambulatory Visit: Payer: Self-pay | Admitting: Certified Nurse Midwife

## 2018-02-26 ENCOUNTER — Encounter: Payer: Self-pay | Admitting: Certified Nurse Midwife

## 2018-02-27 ENCOUNTER — Ambulatory Visit (INDEPENDENT_AMBULATORY_CARE_PROVIDER_SITE_OTHER): Payer: Medicaid Other | Admitting: Maternal Newborn

## 2018-02-27 ENCOUNTER — Encounter: Payer: Self-pay | Admitting: Maternal Newborn

## 2018-02-27 VITALS — BP 108/68 | Ht 63.0 in | Wt 149.0 lb

## 2018-02-27 DIAGNOSIS — M545 Low back pain, unspecified: Secondary | ICD-10-CM

## 2018-02-27 DIAGNOSIS — G43009 Migraine without aura, not intractable, without status migrainosus: Secondary | ICD-10-CM

## 2018-02-27 MED ORDER — CYCLOBENZAPRINE HCL 10 MG PO TABS: 10.0000 mg | ORAL_TABLET | Freq: Three times a day (TID) | ORAL | 2 refills | Status: DC | PRN

## 2018-02-27 NOTE — Progress Notes (Signed)
Postpartum Visit  SVD 02/18/2018  S: Deanna Harris is having frequent migraine headaches. She denies visual changes, epigastric pain, and edema. She gets relief only when taking multiple medications. She also has soreness in her lower back extending from mid-back to her tailbone. No sciatica. Mobility not impaired.  O: Vitals: Blood pressure 108/68, height  (1.6 m), weight 149 lb (67.6 kg).  Physical Exam  Constitutional: She is oriented to person, place, and time. No distress.  HENT:  Head: Normocephalic.  Cardiovascular: Normal rate.  Pulmonary/Chest: Effort normal. No respiratory distress.  Musculoskeletal: Normal range of motion.  Neurological: She is alert and oriented to person, place, and time.  Skin: Skin is warm and dry.  No signs of bruising or infection at epidural site  Psychiatric: She has a normal mood and affect.  No signs of DVT on exam  A: 22 year old female PPD#9 following SVD with recurring migraines and back pain.  P: 1) Migraines: She has Maxalt that was prescribed by her neurologist that she tried with some relief. Recommend continuing to use it as a rescue medication in combination with OTC medications as needed for headaches. Encouraged rest in a dark room. Discussed referral to neurology again if headaches persist after next postpartum visit.  2) Back pain: Advised application of heat, use of muscle rubs. Sent Rx for Flexeril as needed for severe symptoms.  Marcelyn Bruins, CNM 02/27/2018  4:44 PM

## 2018-03-17 ENCOUNTER — Encounter: Payer: Self-pay | Admitting: Maternal Newborn

## 2018-03-17 ENCOUNTER — Ambulatory Visit (INDEPENDENT_AMBULATORY_CARE_PROVIDER_SITE_OTHER): Payer: Medicaid Other | Admitting: Maternal Newborn

## 2018-03-17 DIAGNOSIS — Z30011 Encounter for initial prescription of contraceptive pills: Secondary | ICD-10-CM

## 2018-03-17 DIAGNOSIS — Z124 Encounter for screening for malignant neoplasm of cervix: Secondary | ICD-10-CM

## 2018-03-17 DIAGNOSIS — G43009 Migraine without aura, not intractable, without status migrainosus: Secondary | ICD-10-CM | POA: Insufficient documentation

## 2018-03-17 MED ORDER — LEVONORGEST-ETH ESTRAD 91-DAY 0.15-0.03 &0.01 MG PO TABS: 1.0000 | ORAL_TABLET | Freq: Every day | ORAL | 4 refills | Status: DC

## 2018-03-17 NOTE — Progress Notes (Signed)
Postpartum Visit   History of Present Illness: Patient is a 22 y.o. [id number] presenting for a postpartum visit.  Date of delivery: 02/18/2018 Type of delivery: Vaginal delivery - Vacuum or forceps assisted  no Episiotomy: No Laceration: labial, no repair  Pregnancy or labor problems:  no Any problems since the delivery:  no  Newborn Details:  SINGLETON  Gender: Female.  Birth weight: 3220 g  Maternal Details:  Breast Feeding:  no Post partum depression/anxiety noted:  no Edinburgh Post-Partum Depression Score:  0  Date of last PAP: 04/03/2017, ASCUS, HPV Positive  Review of Systems  Constitutional: Negative.   HENT: Negative.   Eyes: Negative.   Respiratory: Negative for cough, shortness of breath and wheezing.   Cardiovascular: Negative for chest pain and palpitations.  Gastrointestinal: Negative for abdominal pain, constipation, diarrhea and heartburn.  Genitourinary: Negative.   Musculoskeletal: Negative.   Skin: Negative.   Neurological: Positive for headaches.       Migraines without aura, improved with Maxalt  Endo/Heme/Allergies: Negative.   Psychiatric/Behavioral: Negative for depression. The patient is not nervous/anxious.   All other systems reviewed and are negative.   Past Medical History:  Past Medical History:  Diagnosis Date  . Asthma   . Migraine headache with aura   . Ovarian cyst 09/2013   left side    Past Surgical History:  Past Surgical History:  Procedure Laterality Date  . CHOLECYSTECTOMY  04/24/2011  . DILATION AND CURETTAGE OF UTERUS     for RPOC after elective AB    Family History:  Family History  Problem Relation Age of Onset  . Diabetes Mother        ?CHTN  . Lung cancer Maternal Grandmother   . Pancreatic cancer Other   . Heart attack Maternal Grandfather     Social History:  Social History   Socioeconomic History  . Marital status: Single    Spouse name: Not on file  . Number of children: 2  . Years of education: Not  on file  . Highest education level: Not on file  Occupational History  . Occupation: Nurse, children's  Social Needs  . Financial resource strain: Not on file  . Food insecurity:    Worry: Not on file    Inability: Not on file  . Transportation needs:    Medical: Not on file    Non-medical: Not on file  Tobacco Use  . Smoking status: Never Smoker  . Smokeless tobacco: Never Used  Substance and Sexual Activity  . Alcohol use: No  . Drug use: No  . Sexual activity: Not Currently    Partners: Male    Birth control/protection: None  Lifestyle  . Physical activity:    Days per week: Not on file    Minutes per session: Not on file  . Stress: Not on file  Relationships  . Social connections:    Talks on phone: Not on file    Gets together: Not on file    Attends religious service: Not on file    Active member of club or organization: Not on file    Attends meetings of clubs or organizations: Not on file    Relationship status: Not on file  . Intimate partner violence:    Fear of current or ex partner: Not on file    Emotionally abused: Not on file    Physically abused: Not on file    Forced sexual activity: Not on file  Other Topics Concern  .  Not on file  Social History Narrative  . Not on file    Allergies:  Allergies  Allergen Reactions  . Tramadol Hives    Medications: Prior to Admission medications   Medication Sig Start Date End Date Taking? Authorizing Provider  cyclobenzaprine (FLEXERIL) 10 MG tablet Take 1 tablet (10 mg total) by mouth 3 (three) times daily as needed for muscle spasms. 02/27/18   Oswaldo ConroySchmid, Jacelyn Y, CNM  Fe Fum-Fe Poly-Vit C-Lactobac (FUSION) 65-65-25-30 MG CAPS Take 1 tablet by mouth daily. 02/19/18   Farrel ConnersGutierrez, Colleen, CNM  ibuprofen (ADVIL,MOTRIN) 600 MG tablet Take 1 tablet (600 mg total) by mouth every 6 (six) hours as needed. 02/19/18   Farrel ConnersGutierrez, Colleen, CNM  norethindrone (MICRONOR,CAMILA,ERRIN) 0.35 MG tablet Take 1 tablet (0.35 mg total)  by mouth daily. Patient not taking: Reported on 02/27/2018 03/16/18   Farrel ConnersGutierrez, Colleen, CNM  Prenatal Multivit-Min-Fe-FA (PRENATAL VITAMINS) 0.8 MG tablet Take by mouth.    [provider]    Physical Exam Vitals:  Vitals:   03/17/18 0818  BP: 100/70  Pulse: 70    General: NAD HEENT: normocephalic, anicteric Pulmonary: No increased work of breathing Thyroid: no enlargment or palpable nodules Breasts: right breast normal without mass, skin or nipple changes or axillary nodes, left breast normal without mass, skin or nipple changes or axillary nodes. Abdomen: soft, non-tender, non-distended.  Umbilicus without lesions.  No hepatomegaly, splenomegaly or masses palpable. No evidence of hernia.  Genitourinary:  External: Normal external female genitalia.  Normal  urethral meatus, normal Bartholin's and Skene's glands.    Vagina: Normal vaginal mucosa, no evidence of prolapse.    Cervix: Grossly normal in appearance, no bleeding  Uterus: Non-enlarged, mobile, normal contour.  No CMT  Adnexa: ovaries non-enlarged, no adnexal masses  Rectal: deferred Extremities: no edema, erythema, or tenderness Neurologic: Grossly intact Psychiatric: mood appropriate, affect full  Assessment: 22 y.o. [id number] presenting for 6 week postpartum visit.  Plan: Problem List Items Addressed This Visit      Other   Postpartum care following vaginal delivery - Primary    Other Visit Diagnoses    Pap smear for cervical cancer screening       Relevant Orders   Pap Lb, Ct-Ng, rfx HPV ASCU   Initiation of oral contraception       Relevant Medications   Levonorgestrel-Ethinyl Estradiol (ASHLYNA) 0.15-0.03 &0.01 MG tablet      1) Contraception: Education given regarding options for contraception. She wishes to restart the OCP that she was taking before this pregnancy (Ashlyna).  2)  Pap - ASCCP guidelines and rationale discussed.  Patient opts for yearly screening interval. Has had ASCUS Pap for  the last two years.  3) Patient underwent screening for postpartum depression with no concerns noted.  4) Follow up 1 year for routine annual exam.  Marcelyn BruinsJacelyn Schmid, CNM 03/17/2018  8:41 AM

## 2018-03-19 ENCOUNTER — Ambulatory Visit: Payer: Medicaid Other | Admitting: Certified Nurse Midwife

## 2018-03-21 ENCOUNTER — Encounter: Payer: Self-pay | Admitting: Advanced Practice Midwife

## 2018-03-21 LAB — PAP LB, CT-NG, RFX HPV ASCU
CHLAMYDIA, NUC. ACID AMP: NEGATIVE
GONOCOCCUS, NUC. ACID AMP: NEGATIVE
PAP Smear Comment: 0

## 2018-04-07 ENCOUNTER — Encounter: Payer: Self-pay | Admitting: Maternal Newborn

## 2018-06-10 ENCOUNTER — Other Ambulatory Visit: Payer: Self-pay | Admitting: Certified Nurse Midwife

## 2019-04-05 ENCOUNTER — Other Ambulatory Visit: Payer: Self-pay | Admitting: Certified Nurse Midwife

## 2019-05-29 ENCOUNTER — Other Ambulatory Visit: Payer: Self-pay | Admitting: Maternal Newborn

## 2019-05-29 DIAGNOSIS — Z30011 Encounter for initial prescription of contraceptive pills: Secondary | ICD-10-CM

## 2019-06-01 ENCOUNTER — Other Ambulatory Visit: Payer: Self-pay | Admitting: Maternal Newborn

## 2019-06-01 ENCOUNTER — Telehealth: Payer: Self-pay | Admitting: Maternal Newborn

## 2019-06-01 DIAGNOSIS — Z30011 Encounter for initial prescription of contraceptive pills: Secondary | ICD-10-CM

## 2019-06-01 NOTE — Telephone Encounter (Signed)
Patient needs refill of bc to get her to her annual scheduled 9/8 with JS.  Preferred pharmacy [facility] [place].

## 2019-06-01 NOTE — Telephone Encounter (Signed)
Pt aware.

## 2019-06-16 ENCOUNTER — Ambulatory Visit: Payer: Medicaid Other | Admitting: Maternal Newborn

## 2019-07-01 ENCOUNTER — Other Ambulatory Visit: Payer: Self-pay

## 2019-07-01 ENCOUNTER — Ambulatory Visit (INDEPENDENT_AMBULATORY_CARE_PROVIDER_SITE_OTHER): Payer: Medicaid Other | Admitting: Maternal Newborn

## 2019-07-01 ENCOUNTER — Encounter: Payer: Self-pay | Admitting: Maternal Newborn

## 2019-07-01 ENCOUNTER — Other Ambulatory Visit (HOSPITAL_COMMUNITY)
Admission: RE | Admit: 2019-07-01 | Discharge: 2019-07-01 | Disposition: A | Discharge status: Home / Self Care | Payer: Medicaid Other | Source: Ambulatory Visit | Attending: Maternal Newborn | Admitting: Maternal Newborn

## 2019-07-01 VITALS — BP 110/60 | Ht 63.0 in | Wt 158.0 lb

## 2019-07-01 DIAGNOSIS — Z0000 Encounter for general adult medical examination without abnormal findings: Secondary | ICD-10-CM | POA: Diagnosis not present

## 2019-07-01 DIAGNOSIS — G5602 Carpal tunnel syndrome, left upper limb: Secondary | ICD-10-CM

## 2019-07-01 DIAGNOSIS — Z3041 Encounter for surveillance of contraceptive pills: Secondary | ICD-10-CM

## 2019-07-01 DIAGNOSIS — Z124 Encounter for screening for malignant neoplasm of cervix: Secondary | ICD-10-CM | POA: Insufficient documentation

## 2019-07-01 DIAGNOSIS — Z01419 Encounter for gynecological examination (general) (routine) without abnormal findings: Secondary | ICD-10-CM

## 2019-07-01 MED ORDER — LEVONORGEST-ETH ESTRAD 91-DAY 0.15-0.03 &0.01 MG PO TABS: 1.0000 | ORAL_TABLET | Freq: Every day | ORAL | 3 refills | Status: DC

## 2019-07-01 NOTE — Progress Notes (Signed)
Gynecology Annual Exam  PCP: Patient, No Pcp Per  Chief Complaint:  Chief Complaint  Patient presents with  . Gynecologic Exam    History of Present Illness: Patient is a 23 y.o. [id number] presenting for an annual exam.   LMP: Patient's last menstrual period was 06/07/2019. Average Interval: regular, every 90 days (on extended cycle OCP) Duration of flow: 3 to 4 days Heavy Menses: no Clots: no Intermenstrual Bleeding: no Postcoital Bleeding: no Dysmenorrhea: no  The patient is sexually active. She currently uses OCP (estrogen/progesterone) for contraception. She does not have dyspareunia.  The patient does perform self breast exams.  There is no notable family history of breast or ovarian cancer in her family.  The patient wears seatbelts: yes.  The patient has regular exercise: yes, walking 3-4 days per week.  The patient does not have current symptoms of depression.    Domestic violence screening is negative.  Carpal tunnel symptoms on the left side that have been present since pregnancy (early 2019) have worsened. Wearing brace does not relieve symptoms. Tingling/numbness, pain, mostly in hand/wrist but entire arm is affected sometimes. Would like consult.   Review of Systems  Constitutional: Negative.   HENT: Negative.   Eyes: Negative.   Respiratory: Negative for cough, shortness of breath and wheezing.   Cardiovascular: Negative for chest pain and palpitations.  Gastrointestinal: Negative.   Genitourinary: Negative.   Musculoskeletal: Negative.   Skin: Negative.   Neurological: Positive for tingling.       Carpal tunnel symptoms worsening in left hand/wrist/arm  Endo/Heme/Allergies: Negative.   Psychiatric/Behavioral: Negative.   All other systems reviewed and are negative.   Past Medical History:  Past Medical History:  Diagnosis Date  . Asthma   . Atypical squamous cell changes of undetermined significance (ASCUS) on cervical cytology with positive high  risk human papilloma virus (HPV) 04/09/2017  . Migraine headache with aura   . Ovarian cyst 09/2013   left side  . Postpartum care following vaginal delivery 02/18/2018  . Supervision of other normal pregnancy, antepartum 07/25/2017   Clinic [facility] Prenatal Labs Dating 10 wk u/s Blood type: AB/Positive/-- (10/18 1519)  Genetic Screen 1 Screen: neg        Antibody:Negative (10/18 1519) Anatomic [place] NORMAL FEMALE Rubella: 1.38 (10/18 1519)  Varicella: immune GTT 128 RPR: Non Reactive (10/18 1519)  Rhogam n/a HBsAg: Negative (10/18 1519)  TDaP vaccine 01/03/2018 HIV:   Neg Baby Food Bottle                       GBS:  Contraception    Past Surgical History:  Past Surgical History:  Procedure Laterality Date  . CHOLECYSTECTOMY  04/24/2011  . DILATION AND CURETTAGE OF UTERUS     for RPOC after elective AB    Gynecologic History:  Patient's last menstrual period was 06/07/2019. Contraception: OCP (estrogen/progesterone) Last Pap: 03/17/2018. Results were: NILM Obstetric History: [id number]  Family History:  Family History  Problem Relation Age of Onset  . Diabetes Mother        ?CHTN  . Lung cancer Maternal Grandmother   . Pancreatic cancer Other   . Heart attack Maternal Grandfather     Social History:  Social History   Socioeconomic History  . Marital status: Single    Spouse name: Not on file  . Number of children: 2  . Years of education: Not on file  . Highest education level: Not on file  Occupational History  .  Occupation: Careers information officer  Social Needs  . Financial resource strain: Not on file  . Food insecurity    Worry: Not on file    Inability: Not on file  . Transportation needs    Medical: Not on file    Non-medical: Not on file  Tobacco Use  . Smoking status: Never Smoker  . Smokeless tobacco: Never Used  Substance and Sexual Activity  . Alcohol use: No  . Drug use: No  . Sexual activity: Not Currently    Partners: Male    Birth control/protection: None, Pill   Lifestyle  . Physical activity    Days per week: Not on file    Minutes per session: Not on file  . Stress: Not on file  Relationships  . Social Herbalist on phone: Not on file    Gets together: Not on file    Attends religious service: Not on file    Active member of club or organization: Not on file    Attends meetings of clubs or organizations: Not on file    Relationship status: Not on file  . Intimate partner violence    Fear of current or ex partner: Not on file    Emotionally abused: Not on file    Physically abused: Not on file    Forced sexual activity: Not on file  Other Topics Concern  . Not on file  Social History Narrative  . Not on file    Allergies:  Allergies  Allergen Reactions  . Tramadol Hives    Medications: Prior to Admission medications   Medication Sig Start Date End Date Taking? Authorizing Provider  ASHLYNA 0.15-0.03 &0.01 MG tablet TAKE 1 TABLET BY MOUTH DAILY 06/01/19  Yes Rexene Agent, CNM  cyclobenzaprine (FLEXERIL) 10 MG tablet Take 1 tablet (10 mg total) by mouth 3 (three) times daily as needed for muscle spasms. 02/27/18   Rexene Agent, CNM  ibuprofen (ADVIL) 800 MG tablet TAKE 1 TABLET BY MOUTH EVERY 8 HOURS IF NEEDED FOR HEADACHE, MILD OR MODERATE PAIN, OR CRAMPING Patient not taking: Reported on 07/01/2019 04/05/19   Dalia Heading, CNM  Prenatal Multivit-Min-Fe-FA (PRENATAL VITAMINS) 0.8 MG tablet Take by mouth.    [provider]  rizatriptan (MAXALT-MLT) 10 MG disintegrating tablet Take 1 tablet by mouth as needed. 02/25/18   [provider]    Physical Exam Vitals: Blood pressure 110/60, height 5\' 3"  (1.6 m), weight 158 lb (71.7 kg), last menstrual period 06/07/2019, unknown if currently breastfeeding.  General: NAD HEENT: normocephalic, anicteric Thyroid: no enlargement Pulmonary: No increased work of breathing, CTAB Cardiovascular: RRR, no murmurs, rubs, gallops Breast: Breasts  symmetrical, no tenderness, no palpable nodules or masses, no skin or nipple retraction present, no nipple discharge.  No axillary or supraclavicular lymphadenopathy. Abdomen: Soft, non-tender, non-distended.  Umbilicus without lesions.  No hepatomegaly, splenomegaly or masses palpable. No evidence of hernia  Genitourinary:  External: Normal external female genitalia.  Normal urethral  meatus, normal Bartholin's and Skene's glands.    Vagina: Normal vaginal mucosa, no evidence of prolapse.    Cervix: Grossly normal in appearance, no bleeding  Uterus: Non-enlarged, mobile, normal contour.  No CMT  Adnexa: ovaries non-enlarged, no adnexal masses  Rectal: deferred  Lymphatic: no evidence of inguinal lymphadenopathy Extremities: no edema, erythema, or tenderness Neurologic: Grossly intact Psychiatric: mood appropriate, affect full  Assessment: 23 y.o. [id number] annual exam; carpal tunnel symptoms.  Plan: Problem List Items Addressed This Visit  None    Visit Diagnoses    Encounter for annual routine gynecological examination    -  Primary   Pap smear for cervical cancer screening       Relevant Orders   Cytology - PAP   Initiation of oral contraception       Surveillance for birth control, oral contraceptives       Relevant Medications   Levonorgestrel-Ethinyl Estradiol (ASHLYNA) 0.15-0.03 &0.01 MG tablet      1) STI screening was offered and declined.  2) ASCCP guidelines and rationale discussed.  Patient opts for yearly screening interval.  4) Contraception -  Patient is interested in continuing her current form of contraception; satisfied with extended cycle OCPs.  5) Referral to neurology for persistent carpal tunnel symptoms on left side, brace while sleeping no longer helps.  6) Follow up 1 year for an annual exam.  Marcelyn Bruins, CNM 07/01/2019  4:49 PM

## 2019-07-01 NOTE — Patient Instructions (Signed)
Preventive Care 21-25 Years Old, Female Preventive care refers to visits with your health care provider and lifestyle choices that can promote health and wellness. This includes:  A yearly physical exam. This may also be called an annual well check.  Regular dental visits and eye exams.  Immunizations.  Screening for certain conditions.  Healthy lifestyle choices, such as eating a healthy diet, getting regular exercise, not using drugs or products that contain nicotine and tobacco, and limiting alcohol use. What can I expect for my preventive care visit? Physical exam Your health care provider will check your:  Height and weight. This may be used to calculate body mass index (BMI), which tells if you are at a healthy weight.  Heart rate and blood pressure.  Skin for abnormal spots. Counseling Your health care provider may ask you questions about your:  Alcohol, tobacco, and drug use.  Emotional well-being.  Home and relationship well-being.  Sexual activity.  Eating habits.  Work and work environment.  Method of birth control.  Menstrual cycle.  Pregnancy history. What immunizations do I need?  Influenza (flu) vaccine  This is recommended every year. Tetanus, diphtheria, and pertussis (Tdap) vaccine  You may need a Td booster every 10 years. Varicella (chickenpox) vaccine  You may need this if you have not been vaccinated. Human papillomavirus (HPV) vaccine  If recommended by your health care provider, you may need three doses over 6 months. Measles, mumps, and rubella (MMR) vaccine  You may need at least one dose of MMR. You may also need a second dose. Meningococcal conjugate (MenACWY) vaccine  One dose is recommended if you are age 19-21 years and a first-year college student living in a residence hall, or if you have one of several medical conditions. You may also need additional booster doses. Pneumococcal conjugate (PCV13) vaccine  You may need  this if you have certain conditions and were not previously vaccinated. Pneumococcal polysaccharide (PPSV23) vaccine  You may need one or two doses if you smoke cigarettes or if you have certain conditions. Hepatitis A vaccine  You may need this if you have certain conditions or if you travel or work in places where you may be exposed to hepatitis A. Hepatitis B vaccine  You may need this if you have certain conditions or if you travel or work in places where you may be exposed to hepatitis B. Haemophilus influenzae type b (Hib) vaccine  You may need this if you have certain conditions. You may receive vaccines as individual doses or as more than one vaccine together in one shot (combination vaccines). Talk with your health care provider about the risks and benefits of combination vaccines. What tests do I need?  Blood tests  Lipid and cholesterol levels. These may be checked every 5 years starting at age 20.  Hepatitis C test.  Hepatitis B test. Screening  Diabetes screening. This is done by checking your blood sugar (glucose) after you have not eaten for a while (fasting).  Sexually transmitted disease (STD) testing.  BRCA-related cancer screening. This may be done if you have a family history of breast, ovarian, tubal, or peritoneal cancers.  Pelvic exam and Pap test. This may be done every 3 years starting at age 21. Starting at age 30, this may be done every 5 years if you have a Pap test in combination with an HPV test. Talk with your health care provider about your test results, treatment options, and if necessary, the need for more tests.   Follow these instructions at home: Eating and drinking   Eat a diet that includes fresh fruits and vegetables, whole grains, lean protein, and low-fat dairy.  Take vitamin and mineral supplements as recommended by your health care provider.  Do not drink alcohol if: ? Your health care provider tells you not to drink. ? You are  pregnant, may be pregnant, or are planning to become pregnant.  If you drink alcohol: ? Limit how much you have to 0-1 drink a day. ? Be aware of how much alcohol is in your drink. In the [place] one drink equals one 12 oz bottle of beer (355 mL), one 5 oz glass of wine (148 mL), or one 1 oz glass of hard liquor (44 mL). Lifestyle  Take daily care of your teeth and gums.  Stay active. Exercise for at least 30 minutes on 5 or more days each week.  Do not use any products that contain nicotine or tobacco, such as cigarettes, e-cigarettes, and chewing tobacco. If you need help quitting, ask your health care provider.  If you are sexually active, practice safe sex. Use a condom or other form of birth control (contraception) in order to prevent pregnancy and STIs (sexually transmitted infections). If you plan to become pregnant, see your health care provider for a preconception visit. What's next?  Visit your health care provider once a year for a well check visit.  Ask your health care provider how often you should have your eyes and teeth checked.  Stay up to date on all vaccines. This information is not intended to replace advice given to you by your health care provider. Make sure you discuss any questions you have with your health care provider. Document Released: 11/20/2001 Document Revised: 06/05/2018 Document Reviewed: 06/05/2018 Elsevier Patient Education  2020 Elsevier Inc.  

## 2019-07-22 ENCOUNTER — Ambulatory Visit: Payer: Medicaid Other | Admitting: Diagnostic Neuroimaging

## 2019-07-22 ENCOUNTER — Telehealth: Payer: Self-pay | Admitting: *Deleted

## 2019-07-22 ENCOUNTER — Encounter: Payer: Self-pay | Admitting: Diagnostic Neuroimaging

## 2019-07-22 NOTE — Telephone Encounter (Signed)
Patient called this morning and canceled her new patient appointment today, stated she is sick. She rescheduled for Nov.

## 2019-07-29 LAB — CYTOLOGY - PAP: Diagnosis: NEGATIVE

## 2019-08-14 NOTE — Telephone Encounter (Signed)
Pt is following up on yesterdays mychart msg. Aware you are out of the office today and will return Monday. Says if referral can be here in [place] or [place] that will great, since she lives in [place].

## 2019-08-17 ENCOUNTER — Other Ambulatory Visit: Payer: Self-pay | Admitting: Maternal Newborn

## 2019-08-17 DIAGNOSIS — G5602 Carpal tunnel syndrome, left upper limb: Secondary | ICD-10-CM

## 2019-09-01 ENCOUNTER — Ambulatory Visit: Payer: Medicaid Other | Admitting: Diagnostic Neuroimaging

## 2019-09-08 ENCOUNTER — Telehealth: Payer: Self-pay

## 2019-09-08 NOTE — Telephone Encounter (Signed)
Patient notified

## 2019-09-08 NOTE — Telephone Encounter (Signed)
Spoke w/pharmacy. They rcvd new rx from September, it hasn't been activated. They will activate and fill.

## 2019-09-08 NOTE — Telephone Encounter (Signed)
Pt here for AE 06/2019. JS was suppose to send rx for her OCP. She contacted pharmacy to get refill and they said they needed verification. Patient is requesting [place] to contact pharmacy to get this figured out.

## 2019-09-14 ENCOUNTER — Other Ambulatory Visit: Payer: Self-pay

## 2019-09-14 DIAGNOSIS — Z20822 Contact with and (suspected) exposure to covid-19: Secondary | ICD-10-CM

## 2019-09-16 LAB — NOVEL CORONAVIRUS, NAA: SARS-CoV-2, NAA: NOT DETECTED

## 2019-12-17 ENCOUNTER — Telehealth: Payer: Self-pay | Admitting: Family Medicine

## 2019-12-17 ENCOUNTER — Other Ambulatory Visit: Payer: Self-pay

## 2019-12-17 ENCOUNTER — Ambulatory Visit
Admission: EM | Admit: 2019-12-17 | Discharge: 2019-12-17 | Disposition: A | Discharge status: Home / Self Care | Payer: Medicaid Other | Attending: Family Medicine | Admitting: Family Medicine

## 2019-12-17 ENCOUNTER — Encounter: Payer: Self-pay | Admitting: Emergency Medicine

## 2019-12-17 DIAGNOSIS — L853 Xerosis cutis: Secondary | ICD-10-CM | POA: Diagnosis not present

## 2019-12-17 DIAGNOSIS — L309 Dermatitis, unspecified: Secondary | ICD-10-CM

## 2019-12-17 MED ORDER — TACROLIMUS 0.03 % EX OINT: TOPICAL_OINTMENT | Freq: Two times a day (BID) | CUTANEOUS | 0 refills | Status: DC

## 2019-12-17 MED ORDER — TRIAMCINOLONE ACETONIDE 0.5 % EX OINT: 1.0000 "application " | TOPICAL_OINTMENT | Freq: Two times a day (BID) | CUTANEOUS | 0 refills | Status: DC

## 2019-12-17 NOTE — Discharge Instructions (Signed)
Medication as prescribed.  Recommendations to prevent Cracking - Aquafor, CeraVe, No Crack Hand cream from [employer].  Take care  Dr. Adriana Simas

## 2019-12-17 NOTE — ED Triage Notes (Signed)
Pt c/o rash on bilateral hands. Started about 4 days ago. Rash is located on the tops of her hands and is red, itching, and dry. She states that she has had eczema in the past

## 2019-12-17 NOTE — Telephone Encounter (Signed)
Medicaid would not cover Protopic. Sending in Triamcinolone.

## 2019-12-17 NOTE — ED Provider Notes (Signed)
[facility]    CSN: [id number] Arrival date & time: 12/17/19  1119      History   Chief Complaint Chief Complaint  Patient presents with  . Rash   HPI  24 year old female presents with rash.  Patient states that she has eczema.  She states that over the past 4 days it seems to have flared.  She reports erythematous rash and severe dryness particular on the dorsum of her hands.  She states that she has to wash her hands frequently and wear gloves and she believes that this is worsening her underlying eczema.  She was previously treated with Protopic and did well.  However, she is out of the medication.  She is requesting it today.  No other reported symptoms.  No other complaints.  Past Medical History:  Diagnosis Date  . Asthma   . Atypical squamous cell changes of undetermined significance (ASCUS) on cervical cytology with positive high risk human papilloma virus (HPV) 04/09/2017  . Migraine headache with aura   . Ovarian cyst 09/2013   left side  . Postpartum care following vaginal delivery 02/18/2018  . Supervision of other normal pregnancy, antepartum 07/25/2017   [facility] Prenatal Labs Dating 10 wk u/s Blood type: AB/Positive/-- (10/18 1519)  Genetic Screen 1 Screen: neg        Antibody:Negative (10/18 1519) Anatomic [place] NORMAL FEMALE Rubella: 1.38 (10/18 1519)  Varicella: immune GTT 128 RPR: Non Reactive (10/18 1519)  Rhogam n/a HBsAg: Negative (10/18 1519)  TDaP vaccine 01/03/2018 HIV:   Neg Baby Food Bottle                       GBS:  Contraception    Patient Active Problem List   Diagnosis Date Noted  . Migraine without aura and responsive to treatment 03/17/2018  . Chronic pelvic pain in female 04/09/2017    Past Surgical History:  Procedure Laterality Date  . CHOLECYSTECTOMY  04/24/2011  . DILATION AND CURETTAGE OF UTERUS     for RPOC after elective AB    OB History    Gravida  4   Para  3   Term  3   Preterm      AB  1   Living  3     SAB      TAB  1   Ectopic      Multiple  0   Live Births  3            Home Medications    Prior to Admission medications   Medication Sig Start Date End Date Taking? Authorizing Provider  Levonorgestrel-Ethinyl Estradiol (ASHLYNA) 0.15-0.03 &0.01 MG tablet Take 1 tablet by mouth daily. 07/01/19  Yes Oswaldo Conroy, CNM  tacrolimus (PROTOPIC) 0.03 % ointment Apply topically 2 (two) times daily. 12/17/19   Tommie Sams, DO    Family History Family History  Problem Relation Age of Onset  . Diabetes Mother        ?CHTN  . Lung cancer Maternal Grandmother   . Pancreatic cancer Other   . Heart attack Maternal Grandfather     Social History Social History   Tobacco Use  . Smoking status: Never Smoker  . Smokeless tobacco: Never Used  Substance Use Topics  . Alcohol use: No  . Drug use: No     Allergies   Tramadol   Review of Systems Review of Systems  Constitutional: Negative.   Skin: Positive for rash.  Physical Exam Triage Vital Signs ED Triage Vitals  Enc Vitals Group     BP 12/17/19 1138 111/88     Pulse Rate 12/17/19 1138 75     Resp 12/17/19 1138 18     Temp 12/17/19 1138 98.7 F (37.1 C)     Temp Source 12/17/19 1138 Oral     SpO2 12/17/19 1138 100 %     Weight 12/17/19 1135 147 lb (66.7 kg)     Height 12/17/19 1135 5\' 3"  (1.6 m)     Head Circumference --      Peak Flow --      Pain Score 12/17/19 1135 8     Pain Loc --      Pain Edu? --      Excl. in GC? --    Updated Vital Signs BP 111/88 (BP Location: Right Arm)   Pulse 75   Temp 98.7 F (37.1 C) (Oral)   Resp 18   Ht 5\' 3"  (1.6 m)   Wt 66.7 kg   LMP 12/14/2019 (Approximate)   SpO2 100%   Breastfeeding No   BMI 26.04 kg/m   Visual Acuity Right Eye Distance:   Left Eye Distance:   Bilateral Distance:    Right Eye Near:   Left Eye Near:    Bilateral Near:     Physical Exam Vitals and nursing note reviewed.  Constitutional:      General: She is not in acute  distress.    Appearance: Normal appearance. She is not ill-appearing.  HENT:     Head: Normocephalic and atraumatic.  Eyes:     General:        Right eye: No discharge.        Left eye: No discharge.     Conjunctiva/sclera: Conjunctivae normal.  Pulmonary:     Effort: Pulmonary effort is normal. No respiratory distress.  Skin:    Comments: Dorsum of the hands and digits with erythema.  There is extensive dryness and some cracking particularly of the digits.  Neurological:     Mental Status: She is alert.  Psychiatric:        Mood and Affect: Mood normal.        Behavior: Behavior normal.    UC Treatments / Results  Labs (all labs ordered are listed, but only abnormal results are displayed) Labs Reviewed - No data to display  EKG   Radiology No results found.  Procedures Procedures (including critical care time)  Medications Ordered in UC Medications - No data to display  Initial Impression / Assessment and Plan / UC Course  I have reviewed the triage vital signs and the nursing notes.  Pertinent labs & imaging results that were available during my care of the patient were reviewed by me and considered in my medical decision making (see chart for details).    24 year old female presents with eczema and dry skin dermatitis.  Treated with tacrolimus.  Advised use of emollients/moisturizers.  Supportive care.  Final Clinical Impressions(s) / UC Diagnoses   Final diagnoses:  Eczema, unspecified type  Dry skin     Discharge Instructions     Medication as prescribed.  Recommendations to prevent Cracking - Aquafor, CeraVe, No Crack Hand cream from 02/13/2020.  Take care  Dr. 30    ED Prescriptions    Medication Sig Dispense Auth. Provider   tacrolimus (PROTOPIC) 0.03 % ointment Apply topically 2 (two) times daily. 100 g [employer], Adriana Simas  PDMP not reviewed this encounter.   Coral Spikes, DO 12/17/19 1309

## 2020-04-07 DIAGNOSIS — Z419 Encounter for procedure for purposes other than remedying health state, unspecified: Secondary | ICD-10-CM | POA: Diagnosis not present

## 2020-05-08 DIAGNOSIS — Z419 Encounter for procedure for purposes other than remedying health state, unspecified: Secondary | ICD-10-CM | POA: Diagnosis not present

## 2020-06-08 DIAGNOSIS — Z419 Encounter for procedure for purposes other than remedying health state, unspecified: Secondary | ICD-10-CM | POA: Diagnosis not present

## 2020-06-15 DIAGNOSIS — Z793 Long term (current) use of hormonal contraceptives: Secondary | ICD-10-CM | POA: Diagnosis not present

## 2020-06-15 DIAGNOSIS — R519 Headache, unspecified: Secondary | ICD-10-CM | POA: Diagnosis not present

## 2020-06-15 DIAGNOSIS — Z9049 Acquired absence of other specified parts of digestive tract: Secondary | ICD-10-CM | POA: Diagnosis not present

## 2020-06-15 DIAGNOSIS — L309 Dermatitis, unspecified: Secondary | ICD-10-CM | POA: Diagnosis not present

## 2020-06-15 DIAGNOSIS — J45909 Unspecified asthma, uncomplicated: Secondary | ICD-10-CM | POA: Diagnosis not present

## 2020-06-15 DIAGNOSIS — Z885 Allergy status to narcotic agent status: Secondary | ICD-10-CM | POA: Diagnosis not present

## 2020-06-15 DIAGNOSIS — R530 Neoplastic (malignant) related fatigue: Secondary | ICD-10-CM | POA: Diagnosis not present

## 2020-06-15 DIAGNOSIS — Z20822 Contact with and (suspected) exposure to covid-19: Secondary | ICD-10-CM | POA: Diagnosis not present

## 2020-07-08 DIAGNOSIS — Z419 Encounter for procedure for purposes other than remedying health state, unspecified: Secondary | ICD-10-CM | POA: Diagnosis not present

## 2020-08-08 DIAGNOSIS — Z419 Encounter for procedure for purposes other than remedying health state, unspecified: Secondary | ICD-10-CM | POA: Diagnosis not present

## 2020-09-07 DIAGNOSIS — Z419 Encounter for procedure for purposes other than remedying health state, unspecified: Secondary | ICD-10-CM | POA: Diagnosis not present

## 2020-09-23 DIAGNOSIS — Z20822 Contact with and (suspected) exposure to covid-19: Secondary | ICD-10-CM | POA: Diagnosis not present

## 2020-10-08 DIAGNOSIS — Z419 Encounter for procedure for purposes other than remedying health state, unspecified: Secondary | ICD-10-CM | POA: Diagnosis not present

## 2020-10-26 ENCOUNTER — Telehealth: Payer: Self-pay

## 2020-10-26 ENCOUNTER — Other Ambulatory Visit: Payer: Self-pay | Admitting: Advanced Practice Midwife

## 2020-10-26 DIAGNOSIS — Z3041 Encounter for surveillance of contraceptive pills: Secondary | ICD-10-CM

## 2020-10-26 MED ORDER — LEVONORGEST-ETH ESTRAD 91-DAY 0.15-0.03 &0.01 MG PO TABS: 1.0000 | ORAL_TABLET | Freq: Every day | ORAL | 0 refills | Status: DC

## 2020-10-26 NOTE — Telephone Encounter (Signed)
Sent 1 month supply and message to patient regarding refill.

## 2020-10-26 NOTE — Telephone Encounter (Signed)
Patient has apt 11/23/20. Will run out of Ashlyna before them. Requesting refill. Cb#[phone]

## 2020-10-26 NOTE — Progress Notes (Signed)
1 month refill of OCP sent. Patient has annual scheduled next month.

## 2020-11-08 DIAGNOSIS — Z419 Encounter for procedure for purposes other than remedying health state, unspecified: Secondary | ICD-10-CM | POA: Diagnosis not present

## 2020-11-23 ENCOUNTER — Ambulatory Visit (INDEPENDENT_AMBULATORY_CARE_PROVIDER_SITE_OTHER): Payer: Medicaid Other | Admitting: Advanced Practice Midwife

## 2020-11-23 ENCOUNTER — Other Ambulatory Visit: Payer: Self-pay

## 2020-11-23 ENCOUNTER — Encounter: Payer: Self-pay | Admitting: Advanced Practice Midwife

## 2020-11-23 VITALS — BP 119/71 | Ht 63.0 in | Wt 157.0 lb

## 2020-11-23 DIAGNOSIS — Z0000 Encounter for general adult medical examination without abnormal findings: Secondary | ICD-10-CM | POA: Diagnosis not present

## 2020-11-23 DIAGNOSIS — Z3041 Encounter for surveillance of contraceptive pills: Secondary | ICD-10-CM | POA: Diagnosis not present

## 2020-11-23 MED ORDER — LEVONORGEST-ETH ESTRAD 91-DAY 0.15-0.03 &0.01 MG PO TABS: 1.0000 | ORAL_TABLET | Freq: Every day | ORAL | 3 refills | Status: DC

## 2020-11-23 NOTE — Progress Notes (Signed)
Gynecology Annual Exam   PCP: Patient, No Pcp Per  Chief Complaint:  Chief Complaint  Patient presents with  . Annual Exam  . Medication Refill    History of Present Illness: Patient is a 25 y.o. [id number] presents for annual exam. The patient has no complaints today.   LMP: Patient's last menstrual period was 09/04/2020. Average Interval: regular, every 3 months  Duration of flow: 2-3 days Heavy Menses: no Clots: no Intermenstrual Bleeding: no Postcoital Bleeding: no Dysmenorrhea: no  The patient is sexually active. She currently uses OCP (estrogen/progesterone) for contraception. She denies dyspareunia.  The patient does perform self breast exams.  There is no notable family history of breast or ovarian cancer in her family.  The patient wears seatbelts: yes.   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 She tries to eat a healthy diet and stay hydrated with h2o. She admits 6-8 hours of sleep per night.    The patient denies current symptoms of depression.    Review of Systems: Review of Systems  Constitutional: Negative for chills and fever.  HENT: Negative for congestion, ear discharge, ear pain, hearing loss, sinus pain and sore throat.   Eyes: Negative for blurred vision and double vision.  Respiratory: Negative for cough, shortness of breath and wheezing.   Cardiovascular: Negative for chest pain, palpitations and leg swelling.  Gastrointestinal: Negative for abdominal pain, blood in stool, constipation, diarrhea, heartburn, melena, nausea and vomiting.  Genitourinary: Negative for dysuria, flank pain, frequency, hematuria and urgency.  Musculoskeletal: Negative for back pain, joint pain and myalgias.  Skin: Negative for itching and rash.  Neurological: Negative for dizziness, tingling, tremors, sensory change, speech change, focal weakness, seizures, loss of consciousness, weakness and headaches.   Endo/Heme/Allergies: Negative for environmental allergies. Does not bruise/bleed easily.  Psychiatric/Behavioral: Negative for depression, hallucinations, memory loss, substance abuse and suicidal ideas. The patient is not nervous/anxious and does not have insomnia.     Past Medical History:  Patient Active Problem List   Diagnosis Date Noted  . Migraine without aura and responsive to treatment 03/17/2018  . Chronic pelvic pain in female 04/09/2017    Past Surgical History:  Past Surgical History:  Procedure Laterality Date  . CHOLECYSTECTOMY  04/24/2011  . DILATION AND CURETTAGE OF UTERUS     for RPOC after elective AB    Gynecologic History:  Patient's last menstrual period was 09/04/2020. Contraception: OCP (estrogen/progesterone) Last Pap: 2 years ago Results were: no abnormalities   Obstetric History: [id number]  Family History:  Family History  Problem Relation Age of Onset  . Diabetes Mother        ?CHTN  . Lung cancer Maternal Grandmother   . Pancreatic cancer Other   . Heart attack Maternal Grandfather     Social History:  Social History   Socioeconomic History  . Marital status: Single    Spouse name: Not on file  . Number of children: 2  . Years of education: Not on file  . Highest education level: Not on file  Occupational History  . Occupation: Nurse, children's  Tobacco Use  . Smoking status: Never Smoker  . Smokeless tobacco: Never Used  Vaping Use  . Vaping Use: Never used  Substance and Sexual Activity  . Alcohol use: No  . Drug use: No  . Sexual activity: Not Currently    Partners: Male    Birth control/protection: None, Pill  Other Topics  Concern  . Not on file  Social History Narrative  . Not on file   Social Determinants of Health   Financial Resource Strain: Not on file  Food Insecurity: Not on file  Transportation Needs: Not on file  Physical Activity: Not on file  Stress: Not on file  Social Connections: Not on file  Intimate  Partner Violence: Not on file    Allergies:  Allergies  Allergen Reactions  . Tramadol Hives    Medications: Prior to Admission medications   Medication Sig Start Date End Date Taking? Authorizing Provider  Levonorgestrel-Ethinyl Estradiol (ASHLYNA) 0.15-0.03 &0.01 MG tablet Take 1 tablet by mouth daily. 11/23/20   Tresea Mall, CNM    Physical Exam Vitals: Blood pressure 119/71, height 5\' 3"  (1.6 m), weight 157 lb (71.2 kg), last menstrual period 09/04/2020.  General: NAD HEENT: normocephalic, anicteric Thyroid: no enlargement, no palpable nodules Pulmonary: No increased work of breathing, CTAB Cardiovascular: RRR, distal pulses 2+ Breast: Breast symmetrical, no tenderness, no palpable nodules or masses, no skin or nipple retraction present, no nipple discharge.  No axillary or supraclavicular lymphadenopathy. Abdomen: NABS, soft, non-tender, non-distended.  Umbilicus without lesions.  No hepatomegaly, splenomegaly or masses palpable. No evidence of hernia  Genitourinary: deferred for no concerns/PAP interval Extremities: no edema, erythema, or tenderness Neurologic: Grossly intact Psychiatric: mood appropriate, affect full   Assessment: 25 y.o. 32 routine annual exam  Plan: Problem List Items Addressed This Visit   None   Visit Diagnoses    Well woman exam without gynecological exam    -  Primary   Surveillance for birth control, oral contraceptives       Relevant Medications   Levonorgestrel-Ethinyl Estradiol (ASHLYNA) 0.15-0.03 &0.01 MG tablet      2) STI screening  was offered and declined  2)  ASCCP guidelines and rationale discussed.  Patient opts for every 3 years screening interval  3) Contraception - the patient is currently using  OCP (estrogen/progesterone).  She is happy with her current form of contraception and plans to continue  4) Routine healthcare maintenance including cholesterol, diabetes screening discussed Declines  5) Return in about  1 year (around 11/23/2021) for annual established gyn.   11/25/2021, CNM [facility] 11/23/2020, 11:00 AM

## 2020-12-06 DIAGNOSIS — Z419 Encounter for procedure for purposes other than remedying health state, unspecified: Secondary | ICD-10-CM | POA: Diagnosis not present

## 2021-01-06 DIAGNOSIS — Z419 Encounter for procedure for purposes other than remedying health state, unspecified: Secondary | ICD-10-CM | POA: Diagnosis not present

## 2021-02-05 DIAGNOSIS — Z419 Encounter for procedure for purposes other than remedying health state, unspecified: Secondary | ICD-10-CM | POA: Diagnosis not present

## 2021-02-07 DIAGNOSIS — Z885 Allergy status to narcotic agent status: Secondary | ICD-10-CM | POA: Diagnosis not present

## 2021-02-07 DIAGNOSIS — K529 Noninfective gastroenteritis and colitis, unspecified: Secondary | ICD-10-CM | POA: Diagnosis not present

## 2021-02-07 DIAGNOSIS — R1011 Right upper quadrant pain: Secondary | ICD-10-CM | POA: Diagnosis not present

## 2021-02-07 DIAGNOSIS — R10813 Right lower quadrant abdominal tenderness: Secondary | ICD-10-CM | POA: Diagnosis not present

## 2021-02-07 DIAGNOSIS — Z9049 Acquired absence of other specified parts of digestive tract: Secondary | ICD-10-CM | POA: Diagnosis not present

## 2021-02-07 DIAGNOSIS — R55 Syncope and collapse: Secondary | ICD-10-CM | POA: Diagnosis not present

## 2021-02-07 DIAGNOSIS — R1031 Right lower quadrant pain: Secondary | ICD-10-CM | POA: Diagnosis not present

## 2021-03-08 DIAGNOSIS — Z419 Encounter for procedure for purposes other than remedying health state, unspecified: Secondary | ICD-10-CM | POA: Diagnosis not present

## 2021-04-07 DIAGNOSIS — Z419 Encounter for procedure for purposes other than remedying health state, unspecified: Secondary | ICD-10-CM | POA: Diagnosis not present

## 2021-05-08 DIAGNOSIS — Z20822 Contact with and (suspected) exposure to covid-19: Secondary | ICD-10-CM | POA: Diagnosis not present

## 2021-05-08 DIAGNOSIS — Z419 Encounter for procedure for purposes other than remedying health state, unspecified: Secondary | ICD-10-CM | POA: Diagnosis not present

## 2021-06-08 DIAGNOSIS — Z419 Encounter for procedure for purposes other than remedying health state, unspecified: Secondary | ICD-10-CM | POA: Diagnosis not present

## 2021-07-05 DIAGNOSIS — G5603 Carpal tunnel syndrome, bilateral upper limbs: Secondary | ICD-10-CM | POA: Diagnosis not present

## 2021-07-08 DIAGNOSIS — Z419 Encounter for procedure for purposes other than remedying health state, unspecified: Secondary | ICD-10-CM | POA: Diagnosis not present

## 2021-08-02 DIAGNOSIS — G5602 Carpal tunnel syndrome, left upper limb: Secondary | ICD-10-CM | POA: Diagnosis not present

## 2021-08-04 DIAGNOSIS — G5603 Carpal tunnel syndrome, bilateral upper limbs: Secondary | ICD-10-CM | POA: Diagnosis not present

## 2021-08-08 DIAGNOSIS — Z419 Encounter for procedure for purposes other than remedying health state, unspecified: Secondary | ICD-10-CM | POA: Diagnosis not present

## 2021-08-17 DIAGNOSIS — G5622 Lesion of ulnar nerve, left upper limb: Secondary | ICD-10-CM | POA: Diagnosis not present

## 2021-08-21 ENCOUNTER — Other Ambulatory Visit: Payer: Self-pay

## 2021-08-21 ENCOUNTER — Ambulatory Visit
Admission: EM | Admit: 2021-08-21 | Discharge: 2021-08-21 | Disposition: A | Discharge status: Home / Self Care | Payer: Medicaid Other | Attending: Internal Medicine | Admitting: Internal Medicine

## 2021-08-21 DIAGNOSIS — T7840XA Allergy, unspecified, initial encounter: Secondary | ICD-10-CM

## 2021-08-21 MED ORDER — CYPROHEPTADINE HCL 4 MG PO TABS
4.0000 mg | ORAL_TABLET | Freq: Three times a day (TID) | ORAL | 0 refills | Status: DC | PRN
Start: 2021-08-21 — End: 2021-12-06

## 2021-08-21 MED ORDER — METHYLPREDNISOLONE 4 MG PO TBPK: ORAL_TABLET | ORAL | 0 refills | Status: DC

## 2021-08-21 NOTE — ED Triage Notes (Signed)
Pt here with C/O rash, has been using CBD ointment on new tattoo and not has developed a rash on arms and hands.

## 2021-08-21 NOTE — ED Provider Notes (Addendum)
[facility]    CSN: [id number] Arrival date & time: 08/21/21  1718      History   Chief Complaint Chief Complaint  Patient presents with   Rash    HPI Deanna Harris is a 25 y.o. female who presents with rash on arms and hands.  She has been applying CBD ointment on her new tatoo. Only the areas that touch that ointment have itching and rash. No OTC meds have helped with itching. Denies pain on hands, rash on feet or mouth sores or fever.     Past Medical History:  Diagnosis Date   Asthma    Atypical squamous cell changes of undetermined significance (ASCUS) on cervical cytology with positive high risk human papilloma virus (HPV) 04/09/2017   Migraine headache with aura    Ovarian cyst 09/2013   left side   Postpartum care following vaginal delivery 02/18/2018   Supervision of other normal pregnancy, antepartum 07/25/2017   [facility] Prenatal Labs Dating 10 wk u/s Blood type: AB/Positive/-- (10/18 1519)  Genetic Screen 1 Screen: neg        Antibody:Negative (10/18 1519) Anatomic [place] NORMAL FEMALE Rubella: 1.38 (10/18 1519)  Varicella: immune GTT 128 RPR: Non Reactive (10/18 1519)  Rhogam n/a HBsAg: Negative (10/18 1519)  TDaP vaccine 01/03/2018 HIV:   Neg Baby Food Bottle                       GBS:  Contraception    Patient Active Problem List   Diagnosis Date Noted   Migraine without aura and responsive to treatment 03/17/2018   Chronic pelvic pain in female 04/09/2017    Past Surgical History:  Procedure Laterality Date   CHOLECYSTECTOMY  04/24/2011   DILATION AND CURETTAGE OF UTERUS     for RPOC after elective AB    OB History     Gravida  4   Para  3   Term  3   Preterm      AB  1   Living  3      SAB      IAB  1   Ectopic      Multiple  0   Live Births  3            Home Medications    Prior to Admission medications   Medication Sig Start Date End Date Taking? Authorizing Provider  cyproheptadine (PERIACTIN) 4  MG tablet Take 1 tablet (4 mg total) by mouth 3 (three) times daily as needed for allergies. 08/21/21  Yes Rodriguez-Southworth, Nettie Elm, PA-C  Levonorgestrel-Ethinyl Estradiol (ASHLYNA) 0.15-0.03 &0.01 MG tablet Take 1 tablet by mouth daily. 11/23/20  Yes Tresea Mall, CNM  methylPREDNISolone (MEDROL DOSEPAK) 4 MG TBPK tablet Take as directed 08/21/21  Yes Rodriguez-Southworth, Nettie Elm, PA-C    Family History Family History  Problem Relation Age of Onset   Diabetes Mother        ?CHTN   Lung cancer Maternal Grandmother    Pancreatic cancer Other    Heart attack Maternal Grandfather     Social History Social History   Tobacco Use   Smoking status: Never   Smokeless tobacco: Never  Vaping Use   Vaping Use: Never used  Substance Use Topics   Alcohol use: No   Drug use: No     Allergies   Tramadol   Review of Systems Review of Systems + itchy rash, the rest is neg.   Physical Exam Triage  Vital Signs ED Triage Vitals  Enc Vitals Group     BP 08/21/21 1759 124/86     Pulse Rate 08/21/21 1759 90     Resp 08/21/21 1759 18     Temp 08/21/21 1759 98.6 F (37 C)     Temp Source 08/21/21 1759 Oral     SpO2 08/21/21 1759 98 %     Weight 08/21/21 1758 150 lb (68 kg)     Height 08/21/21 1758 5\' 3"  (1.6 m)     Head Circumference --      Peak Flow --      Pain Score 08/21/21 1758 0     Pain Loc --      Pain Edu? --      Excl. in GC? --    No data found.  Updated Vital Signs BP 124/86 (BP Location: Left Arm)   Pulse 90   Temp 98.6 F (37 C) (Oral)   Resp 18   Ht 5\' 3"  (1.6 m)   Wt 150 lb (68 kg)   LMP 06/22/2021   SpO2 98%   BMI 26.57 kg/m   Visual Acuity Right Eye Distance:   Left Eye Distance:   Bilateral Distance:    Right Eye Near:   Left Eye Near:    Bilateral Near:     Physical Exam Vitals and nursing note reviewed.  Constitutional:      General: She is not in acute distress.    Appearance: She is obese. She is not toxic-appearing.  HENT:      Right Ear: External ear normal.     Left Ear: External ear normal.  Eyes:     General: No scleral icterus.    Conjunctiva/sclera: Conjunctivae normal.  Pulmonary:     Effort: Pulmonary effort is normal.  Musculoskeletal:        General: Normal range of motion.     Cervical back: Neck supple.  Skin:    General: Skin is warm and dry.     Comments: Has fine papular erythematous rash on her dorsal hands, palms and on tattoo areas on forearms. Nothing on upper arms, abdomen or back.   Neurological:     Mental Status: She is alert and oriented to person, place, and time.     Gait: Gait normal.  Psychiatric:        Mood and Affect: Mood normal.        Behavior: Behavior normal.        Thought Content: Thought content normal.        Judgment: Judgment normal.     UC Treatments / Results  Labs (all labs ordered are listed, but only abnormal results are displayed) Labs Reviewed - No data to display  EKG   Radiology No results found.  Procedures Procedures (including critical care time)  Medications Ordered in UC Medications - No data to display  Initial Impression / Assessment and Plan / UC Course  I have reviewed the triage vital signs and the nursing notes. Contact dermatitis I placed her on Periactin and Medrol dose pack as noted.  Final Clinical Impressions(s) / UC Diagnoses   Final diagnoses:  Allergic reaction, initial encounter   Discharge Instructions   None    ED Prescriptions     Medication Sig Dispense Auth. Provider   cyproheptadine (PERIACTIN) 4 MG tablet Take 1 tablet (4 mg total) by mouth 3 (three) times daily as needed for allergies. 30 tablet Rodriguez-Southworth, , PA-C   methylPREDNISolone (MEDROL  DOSEPAK) 4 MG TBPK tablet Take as directed 21 tablet Rodriguez-Southworth, Nettie Elm, PA-C      PDMP not reviewed this encounter.   Garey Ham, PA-C 08/21/21 1859    Rodriguez-Southworth, Nettie Elm, PA-C 08/21/21 2027

## 2021-08-23 DIAGNOSIS — G5602 Carpal tunnel syndrome, left upper limb: Secondary | ICD-10-CM | POA: Insufficient documentation

## 2021-08-23 DIAGNOSIS — G5622 Lesion of ulnar nerve, left upper limb: Secondary | ICD-10-CM | POA: Diagnosis not present

## 2021-09-07 DIAGNOSIS — Z419 Encounter for procedure for purposes other than remedying health state, unspecified: Secondary | ICD-10-CM | POA: Diagnosis not present

## 2021-09-14 DIAGNOSIS — J45909 Unspecified asthma, uncomplicated: Secondary | ICD-10-CM | POA: Diagnosis not present

## 2021-09-14 DIAGNOSIS — G5622 Lesion of ulnar nerve, left upper limb: Secondary | ICD-10-CM | POA: Diagnosis not present

## 2021-09-14 DIAGNOSIS — G8918 Other acute postprocedural pain: Secondary | ICD-10-CM | POA: Diagnosis not present

## 2021-09-14 DIAGNOSIS — G43109 Migraine with aura, not intractable, without status migrainosus: Secondary | ICD-10-CM | POA: Diagnosis not present

## 2021-09-14 DIAGNOSIS — G5602 Carpal tunnel syndrome, left upper limb: Secondary | ICD-10-CM | POA: Diagnosis not present

## 2021-10-08 DIAGNOSIS — Z419 Encounter for procedure for purposes other than remedying health state, unspecified: Secondary | ICD-10-CM | POA: Diagnosis not present

## 2021-10-17 ENCOUNTER — Other Ambulatory Visit: Payer: Self-pay

## 2021-10-17 DIAGNOSIS — Z3041 Encounter for surveillance of contraceptive pills: Secondary | ICD-10-CM

## 2021-10-17 MED ORDER — LEVONORGEST-ETH ESTRAD 91-DAY 0.15-0.03 &0.01 MG PO TABS: 1.0000 | ORAL_TABLET | Freq: Every day | ORAL | 0 refills | Status: DC

## 2021-10-17 NOTE — Telephone Encounter (Signed)
Pt calling for a refill of bcp; has appt scheduled for Feb and will run out before then.  [phone]  Pt aware refill eRx'd.

## 2021-11-08 DIAGNOSIS — Z419 Encounter for procedure for purposes other than remedying health state, unspecified: Secondary | ICD-10-CM | POA: Diagnosis not present

## 2021-11-22 ENCOUNTER — Encounter: Payer: Medicaid Other | Admitting: Advanced Practice Midwife

## 2021-11-22 ENCOUNTER — Other Ambulatory Visit: Payer: Self-pay

## 2021-11-22 ENCOUNTER — Encounter: Payer: Self-pay | Admitting: Advanced Practice Midwife

## 2021-11-23 NOTE — Progress Notes (Signed)
This encounter was created in error - please disregard.

## 2021-12-06 ENCOUNTER — Ambulatory Visit (INDEPENDENT_AMBULATORY_CARE_PROVIDER_SITE_OTHER): Payer: Medicaid Other | Admitting: Advanced Practice Midwife

## 2021-12-06 ENCOUNTER — Encounter: Payer: Self-pay | Admitting: Advanced Practice Midwife

## 2021-12-06 ENCOUNTER — Other Ambulatory Visit: Payer: Self-pay

## 2021-12-06 ENCOUNTER — Other Ambulatory Visit (HOSPITAL_COMMUNITY)
Admission: RE | Admit: 2021-12-06 | Discharge: 2021-12-06 | Disposition: A | Discharge status: Home / Self Care | Payer: Medicaid Other | Source: Ambulatory Visit | Attending: Advanced Practice Midwife | Admitting: Advanced Practice Midwife

## 2021-12-06 VITALS — BP 111/72 | Ht 62.0 in | Wt 165.0 lb

## 2021-12-06 DIAGNOSIS — Z113 Encounter for screening for infections with a predominantly sexual mode of transmission: Secondary | ICD-10-CM | POA: Insufficient documentation

## 2021-12-06 DIAGNOSIS — Z0000 Encounter for general adult medical examination without abnormal findings: Secondary | ICD-10-CM

## 2021-12-06 DIAGNOSIS — Z3041 Encounter for surveillance of contraceptive pills: Secondary | ICD-10-CM | POA: Diagnosis not present

## 2021-12-06 DIAGNOSIS — Z01419 Encounter for gynecological examination (general) (routine) without abnormal findings: Secondary | ICD-10-CM | POA: Diagnosis not present

## 2021-12-06 DIAGNOSIS — Z124 Encounter for screening for malignant neoplasm of cervix: Secondary | ICD-10-CM | POA: Diagnosis not present

## 2021-12-06 DIAGNOSIS — Z419 Encounter for procedure for purposes other than remedying health state, unspecified: Secondary | ICD-10-CM | POA: Diagnosis not present

## 2021-12-06 MED ORDER — LEVONORGEST-ETH ESTRAD 91-DAY 0.15-0.03 &0.01 MG PO TABS: 1.0000 | ORAL_TABLET | Freq: Every day | ORAL | 3 refills | Status: DC

## 2021-12-06 NOTE — Patient Instructions (Signed)
Managing Depression, Adult Depression is a mental health condition that affects your thoughts, feelings, and actions. Being diagnosed with depression can bring you relief if you did not know why you have felt or behaved a certain way. It could also leave you feeling overwhelmed with uncertainty about your future. Preparing yourself to manage your symptoms can help you feel more positive about your future. How to manage lifestyle changes Managing stress Stress is your body's reaction to life changes and events, both good and bad. Stress can add to your feelings of depression. Learning to manage your stress can help lessen your feelings of depression. Try some of the following approaches to reducing your stress (stress reduction techniques): Listen to music that you enjoy and that inspires you. Try using a meditation app or take a meditation class. Develop a practice that helps you connect with your spiritual self. Walk in nature, pray, or go to a place of worship. Do some deep breathing. To do this, inhale slowly through your nose. Pause at the top of your inhale for a few seconds and then exhale slowly, letting your muscles relax. Practice yoga to help relax and work your muscles. Choose a stress reduction technique that suits your lifestyle and personality. These techniques take time and practice to develop. Set aside 5-15 minutes a day to do them. Therapists can offer training in these techniques. Other things you can do to manage stress include: Keeping a stress diary. Knowing your limits and saying no when you think something is too much. Paying attention to how you react to certain situations. You may not be able to control everything, but you can change your reaction. Adding humor to your life by watching funny films or TV shows. Making time for activities that you enjoy and that relax you.  Medicines Medicines, such as antidepressants, are often a part of treatment for depression. Talk  with your pharmacist or health care provider about all the medicines, supplements, and herbal products that you take, their possible side effects, and what medicines and other products are safe to take together. Make sure to report any side effects you may have to your health care provider. Relationships Your health care provider may suggest family therapy, couples therapy, or individual therapy as part of your treatment. How to recognize changes Everyone responds differently to treatment for depression. As you recover from depression, you may start to: Have more interest in doing activities. Feel less hopeless. Have more energy. Overeat less often, or have a better appetite. Have better mental focus. It is important to recognize if your depression is not getting better or is getting worse. The symptoms you had in the beginning may return, such as: Tiredness (fatigue) or low energy. Eating too much or too little. Sleeping too much or too little. Feeling restless, agitated, or hopeless. Trouble focusing or making decisions. Unexplained physical complaints. Feeling irritable, angry, or aggressive. If you or your family members notice these symptoms coming back, let your health care provider know right away. Follow these instructions at home: Activity  Try to get some form of exercise each day, such as walking, biking, swimming, or lifting weights. Practice stress reduction techniques. Engage your mind by taking a class or doing some volunteer work. Lifestyle Get the right amount and quality of sleep. Cut down on using caffeine, tobacco, alcohol, and other potentially harmful substances. Eat a healthy diet that includes plenty of vegetables, fruits, whole grains, low-fat dairy products, and lean protein. Do not eat a lot  of foods that are high in solid fats, added sugars, or salt (sodium). General instructions Take over-the-counter and prescription medicines only as told by your health  care provider. Keep all follow-up visits as told by your health care provider. This is important. Where to find support Talking to others Friends and family members can be sources of support and guidance. Talk to trusted friends or family members about your condition. Explain your symptoms to them, and let them know that you are working with a health care provider to treat your depression. Tell friends and family members how they also can be helpful. Finances Find appropriate mental health providers that fit with your financial situation. Talk with your health care provider about options to get reduced prices on your medicines. Where to find more information You can find support in your area from: Anxiety and Depression Association of America (ADAA): www.adaa.org Mental Health America: www.mentalhealthamerica.net [employer] on Mental Illness: www.nami.org Contact a health care provider if: You stop taking your antidepressant medicines, and you have any of these symptoms: Nausea. Headache. Light-headedness. Chills and body aches. Not being able to sleep (insomnia). You or your friends and family think your depression is getting worse. Get help right away if: You have thoughts of hurting yourself or others. If you ever feel like you may hurt yourself or others, or have thoughts about taking your own life, get help right away. Go to your nearest emergency department or: Call your local emergency services (911 in the [place] Call a suicide crisis helpline, such as the [facility] at 347-667-2778 or 988 in the [place]. This is open 24 hours a day in the [place] Text the Crisis Text Line at 506-882-3703 (in the [place].). Summary If you are diagnosed with depression, preparing yourself to manage your symptoms is a good way to feel positive about your future. Work with your health care provider on a management plan that includes stress reduction techniques, medicines (if  applicable), therapy, and healthy lifestyle habits. Keep talking with your health care provider about how your treatment is working. If you have thoughts about taking your own life, call a suicide crisis helpline or text a crisis text line. This information is not intended to replace advice given to you by your health care provider. Make sure you discuss any questions you have with your health care provider. Document Revised: 04/19/2021 Document Reviewed: 08/05/2019 Elsevier Patient Education  2022 [place], Adult After being diagnosed with anxiety, you may be relieved to know why you have felt or behaved a certain way. You may also feel overwhelmed about the treatment ahead and what it will mean for your life. With care and support, you can manage this condition. How to manage lifestyle changes Managing stress and anxiety Stress is your body's reaction to life changes and events, both good and bad. Most stress will last just a few hours, but stress can be ongoing and can lead to more than just stress. Although stress can play a major role in anxiety, it is not the same as anxiety. Stress is usually caused by something external, such as a deadline, test, or competition. Stress normally passes after the triggering event has ended.  Anxiety is caused by something internal, such as imagining a terrible outcome or worrying that something will go wrong that will devastate you. Anxiety often does not go away even after the triggering event is over, and it can become long-term (chronic) worry. It is important to understand  the differences between stress and anxiety and to manage your stress effectively so that it does not lead to an anxious response. Talk with your health care provider or a counselor to learn more about reducing anxiety and stress. He or she may suggest tension reduction techniques, such as: Music therapy. Spend time creating or listening to music that you enjoy and that  inspires you. Mindfulness-based meditation. Practice being aware of your normal breaths while not trying to control your breathing. It can be done while sitting or walking. Centering prayer. This involves focusing on a word, phrase, or sacred image that means something to you and brings you peace. Deep breathing. To do this, expand your stomach and inhale slowly through your nose. Hold your breath for 3-5 seconds. Then exhale slowly, letting your stomach muscles relax. Self-talk. Learn to notice and identify thought patterns that lead to anxiety reactions and change those patterns to thoughts that feel peaceful. Muscle relaxation. Taking time to tense muscles and then relax them. Choose a tension reduction technique that fits your lifestyle and personality. These techniques take time and practice. Set aside 5-15 minutes a day to do them. Therapists can offer counseling and training in these techniques. The training to help with anxiety may be covered by some insurance plans. Other things you can do to manage stress and anxiety include: Keeping a stress diary. This can help you learn what triggers your reaction and then learn ways to manage your response. Thinking about how you react to certain situations. You may not be able to control everything, but you can control your response. Making time for activities that help you relax and not feeling guilty about spending your time in this way. Doing visual imagery. This involves imagining or creating mental pictures to help you relax. Practicing yoga. Through yoga poses, you can lower tension and promote relaxation.  Medicines Medicines can help ease symptoms. Medicines for anxiety include: Antidepressant medicines. These are usually prescribed for long-term daily control. Anti-anxiety medicines. These may be added in severe cases, especially when panic attacks occur. Medicines will be prescribed by a health care provider. When used together, medicines,  psychotherapy, and tension reduction techniques may be the most effective treatment. Relationships Relationships can play a big part in helping you recover. Try to spend more time connecting with trusted friends and family members. Consider going to couples counseling if you have a partner, taking family education classes, or going to family therapy. Therapy can help you and others better understand your condition. How to recognize changes in your anxiety Everyone responds differently to treatment for anxiety. Recovery from anxiety happens when symptoms decrease and stop interfering with your daily activities at home or work. This may mean that you will start to: Have better concentration and focus. Worry will interfere less in your daily thinking. Sleep better. Be less irritable. Have more energy. Have improved memory. It is also important to recognize when your condition is getting worse. Contact your health care provider if your symptoms interfere with home or work and you feel like your condition is not improving. Follow these instructions at home: Activity Exercise. Adults should do the following: Exercise for at least 150 minutes each week. The exercise should increase your heart rate and make you sweat (moderate-intensity exercise). Strengthening exercises at least twice a week. Get the right amount and quality of sleep. Most adults need 7-9 hours of sleep each night. Lifestyle  Eat a healthy diet that includes plenty of vegetables, fruits, whole grains,  low-fat dairy products, and lean protein. Do not eat a lot of foods that are high in fats, added sugars, or salt (sodium). Make choices that simplify your life. Do not use any products that contain nicotine or tobacco. These products include cigarettes, chewing tobacco, and vaping devices, such as e-cigarettes. If you need help quitting, ask your health care provider. Avoid caffeine, alcohol, and certain over-the-counter cold  medicines. These may make you feel worse. Ask your pharmacist which medicines to avoid. General instructions Take over-the-counter and prescription medicines only as told by your health care provider. Keep all follow-up visits. This is important. Where to find support You can get help and support from these sources: Self-help groups. Online and [employer]. A trusted spiritual leader. Couples counseling. Family education classes. Family therapy. Where to find more information You may find that joining a support group helps you deal with your anxiety. The following sources can help you locate counselors or support groups near you: [place]: www.mentalhealthamerica.net Anxiety and Depression Association of [place] (ADAA): https://www.clark.net/ National Alliance on Mental Illness (NAMI): www.nami.org Contact a health care provider if: You have a hard time staying focused or finishing daily tasks. You spend many hours a day feeling worried about everyday life. You become exhausted by worry. You start to have headaches or frequently feel tense. You develop chronic nausea or diarrhea. Get help right away if: You have a racing heart and shortness of breath. You have thoughts of hurting yourself or others. If you ever feel like you may hurt yourself or others, or have thoughts about taking your own life, get help right away. Go to your nearest emergency department or: Call your local emergency services (911 in the [place] Call a suicide crisis helpline, such as the [facility] at (719)530-6067 or 988 in the [place]. This is open 24 hours a day in the [place] Text the Crisis Text Line at 848-312-4622 (in the [place].). Summary Taking steps to learn and use tension reduction techniques can help calm you and help prevent triggering an anxiety reaction. When used together, medicines, psychotherapy, and tension reduction techniques may be the most effective  treatment. Family, friends, and partners can play a big part in supporting you. This information is not intended to replace advice given to you by your health care provider. Make sure you discuss any questions you have with your health care provider. Document Revised: 04/19/2021 Document Reviewed: 01/15/2021 Elsevier Patient Education  2022 [place], Adult Feeling a certain amount of stress is normal. Stress helps our body and mind get ready to deal with the demands of life. Stress hormones can motivate you to do well at work and meet your responsibilities. But severe or long-term (chronic) stress can affect your mental and physical health. Chronic stress puts you at higher risk for: Anxiety and depression. Other health problems such as digestive problems, muscle aches, heart disease, high blood pressure, and stroke. What are the causes? Common causes of stress include: Demands from work, such as deadlines, feeling overworked, or having long hours. Pressures at home, such as money issues, disagreements with a spouse, or parenting issues. Pressures from major life changes, such as divorce, moving, loss of a loved one, or chronic illness. You may be at higher risk for stress-related problems if you: Do not get enough sleep. Are in poor health. Do not have emotional support. Have a mental health disorder such as anxiety or depression. How to recognize stress Stress can make you:  Have trouble sleeping. Feel sad, anxious, irritable, or overwhelmed. Lose your appetite. Overeat or want to eat unhealthy foods. Want to use drugs or alcohol. Stress can also cause physical symptoms, such as: Sore, tense muscles, especially in the shoulders and neck. Headaches. Trouble breathing. A faster heart rate. Stomach pain, nausea, or vomiting. Diarrhea or constipation. Trouble concentrating. Follow these instructions at home: Eating and drinking Eat a healthy diet. This  includes: Eating foods that are high in fiber, such as beans, whole grains, and fresh fruits and vegetables. Limiting foods that are high in fat and processed sugars, such as fried or sweet foods. Do not skip meals or overeat. Drink enough fluid to keep your urine pale yellow. Alcohol use Do not drink alcohol if: Your health care provider tells you not to drink. You are pregnant, may be pregnant, or are planning to become pregnant. Drinking alcohol is a way some people try to ease their stress. This can be dangerous, so if you drink alcohol: Limit how much you have to: 0-1 drink a day for women. 0-2 drinks a day for men. Know how much alcohol is in your drink. In the [place] one drink equals one 12 oz bottle of beer (355 mL), one 5 oz glass of wine (148 mL), or one 1 oz glass of hard liquor (44 mL). Activity  Include 30 minutes of exercise in your daily schedule. Exercise is a good stress reducer. Include time in your day for an activity that you find relaxing. Try taking a walk, going on a bike ride, reading a book, or listening to music. Schedule your time in a way that lowers stress, and keep a regular schedule. Focus on doing what is most important to get done. Lifestyle Identify the source of your stress and your reaction to it. See a therapist who can help you change unhelpful reactions. When there are stressful events: Talk about them with family, friends, or coworkers. Try to think realistically about stressful events and not ignore them or overreact. Try to find the positives in a stressful situation and not focus on the negatives. Cut back on responsibilities at work and home, if possible. Ask for help from friends or family members if you need it. Find ways to manage stress, such as: Mindfulness, meditation, or deep breathing. Yoga or tai chi. Progressive muscle relaxation. Spending time in nature. Doing art, playing music, or reading. Making time for fun  activities. Spending time with family and friends. Get support from family, friends, or spiritual resources. General instructions Get enough sleep. Try to go to sleep and get up at about the same time every day. Take over-the-counter and prescription medicines only as told by your health care provider. Do not use any products that contain nicotine or tobacco. These products include cigarettes, chewing tobacco, and vaping devices, such as e-cigarettes. If you need help quitting, ask your health care provider. Do not use drugs or smoke to deal with stress. Keep all follow-up visits. This is important. Where to find support Talk with your health care provider about stress management or finding a support group. Find a therapist to work with you on your stress management techniques. Where to find more information [employer] on Mental Illness: www.nami.org American Psychological Association: TVStere[employer].ch Contact a health care provider if: Your stress symptoms get worse. You are unable to manage your stress at home. You are struggling to stop using drugs or alcohol. Get help right away if: You may be a danger to yourself  or others. You have any thoughts of death or suicide. Get help right awayif you feel like you may hurt yourself or others, or have thoughts about taking your own life. Go to your nearest emergency room or: Call 911. Call the [address] at (507) 549-0541 or 988 in the [place] This is open 24 hours a day. Text the Crisis Text Line at 343-289-0786. Summary Feeling a certain amount of stress is normal, but severe or long-term (chronic) stress can affect your mental and physical health. Chronic stress can put you at higher risk for anxiety, depression, and other health problems such as digestive problems, muscle aches, heart disease, high blood pressure, and stroke. You may be at higher risk for stress-related problems if you do not get enough sleep, are in  poor health, lack emotional support, or have a mental health disorder such as anxiety or depression. Identify the source of your stress and your reaction to it. Try talking about stressful events with family, friends, or coworkers, finding a coping method, or getting support from spiritual resources. If you need more help, talk with your health care provider about finding a support group or a mental health therapist. This information is not intended to replace advice given to you by your health care provider. Make sure you discuss any questions you have with your health care provider. Document Revised: 04/20/2021 Document Reviewed: 04/18/2021 Elsevier Patient Education  [place].

## 2021-12-06 NOTE — Progress Notes (Signed)
? ? ? ?Gynecology Annual Exam   ?PCP: Deanna Harris, No Pcp Per (Inactive) ? ?Chief Complaint:  ?Chief Complaint  ?Deanna Harris presents with  ? Gynecologic Exam  ?  No concerns  ? ? ?History of Present Illness: Deanna Harris is a 26 y.o. [id number] presents for annual exam. The Deanna Harris has no gyn complaints today. She is recovering from carpal and cubital tunnel syndrome surgery 3 months ago at [facility]. ? ?LMP: No LMP recorded. (Menstrual status: Oral contraceptives). ?Average Interval: regular, every 3 months ?Duration of flow:  3-4  days ?Heavy Menses: no ?Clots: no ?Intermenstrual Bleeding: no ?Postcoital Bleeding: no ?Dysmenorrhea: no ? ?The Deanna Harris is sexually active. She currently uses OCP (estrogen/progesterone) for contraception. She denies dyspareunia.  The Deanna Harris does perform self breast exams.  There is no notable family history of breast or ovarian cancer in her family. ? ?The Deanna Harris wears seatbelts: yes.   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 She admits healthy diet and adequate hydration and sleep.   ? ?The Deanna Harris denies current symptoms of depression.  She mentions an increase in anxiety with specific behaviors such as biting fingernails or biting lip/inside of mouth when she is feeling nervous or anxious. She has also noticed some hair loss/thinning. She reports a somewhat limited support system. She declines medication/need for therapy at this time.  ? ?Review of Systems: Review of Systems  ?Constitutional:  Negative for chills and fever.  ?HENT:  Negative for congestion, ear discharge, ear pain, hearing loss, sinus pain and sore throat.   ?Eyes:  Negative for blurred vision and double vision.  ?Respiratory:  Negative for cough, shortness of breath and wheezing.   ?Cardiovascular:  Negative for chest pain, palpitations and leg swelling.  ?Gastrointestinal:  Negative for abdominal pain, blood in stool, constipation, diarrhea, heartburn, melena, nausea and  vomiting.  ?Genitourinary:  Negative for dysuria, flank pain, frequency, hematuria and urgency.  ?Musculoskeletal:  Negative for back pain, joint pain and myalgias.  ?Skin:  Negative for itching and rash.  ?Neurological:  Negative for dizziness, tingling, tremors, sensory change, speech change, focal weakness, seizures, loss of consciousness, weakness and headaches.  ?Endo/Heme/Allergies:  Negative for environmental allergies. Does not bruise/bleed easily.  ?Psychiatric/Behavioral:  Negative for depression, hallucinations, memory loss, substance abuse and suicidal ideas. The Deanna Harris is not nervous/anxious and does not have insomnia.   ?     Positive for anxiety  ? ?Past Medical History:  ?Deanna Harris Active Problem List  ? Diagnosis Date Noted  ? Carpal tunnel syndrome of left wrist 08/23/2021  ?  Formatting of this note might be different from the original. ?Added automatically from request for surgery [id number] ?  ? Cubital tunnel syndrome on left 08/23/2021  ?  Formatting of this note might be different from the original. ?Added automatically from request for surgery [id number] ?  ? Migraine without aura and responsive to treatment 03/17/2018  ? Chronic pelvic pain in female 04/09/2017  ? ? ?Past Surgical History:  ?Past Surgical History:  ?Procedure Laterality Date  ? CHOLECYSTECTOMY  04/24/2011  ? DILATION AND CURETTAGE OF UTERUS    ? for RPOC after elective AB  ? ? ?Gynecologic History:  ?No LMP recorded. (Menstrual status: Oral contraceptives). ?Contraception: OCP (estrogen/progesterone) ?Last Pap: 06/30/2021 Results were: no abnormalities  ? ?Obstetric History: [id number] ? ?Family History:  ?Family History  ?Problem Relation Age of Onset  ? Diabetes Mother   ?     ?CHTN  ?  Lung cancer Maternal Aunt   ? Lung cancer Maternal Grandmother   ? Heart attack Maternal Grandfather   ? Pancreatic cancer Other   ? ? ?Social History:  ?Social History  ? ?Socioeconomic History  ? Marital status: Single  ?  Spouse name: Not on file   ? Number of children: 2  ? Years of education: Not on file  ? Highest education level: Not on file  ?Occupational History  ? Occupation: Nurse, children's  ?Tobacco Use  ? Smoking status: Never  ? Smokeless tobacco: Never  ?Vaping Use  ? Vaping Use: Never used  ?Substance and Sexual Activity  ? Alcohol use: No  ? Drug use: No  ? Sexual activity: Yes  ?  Partners: Male  ?  Birth control/protection: Pill  ?Other Topics Concern  ? Not on file  ?Social History Narrative  ? Not on file  ? ?Social Determinants of Health  ? ?Financial Resource Strain: Not on file  ?Food Insecurity: Not on file  ?Transportation Needs: Not on file  ?Physical Activity: Not on file  ?Stress: Not on file  ?Social Connections: Not on file  ?Intimate Partner Violence: Not on file  ? ? ?Allergies:  ?Allergies  ?Allergen Reactions  ? Vancomycin Swelling  ? Tramadol Hives  ? ? ?Medications: ?Prior to Admission medications   ?Medication Sig Start Date End Date Taking? Authorizing Provider  ?Levonorgestrel-Ethinyl Estradiol (ASHLYNA) 0.15-0.03 &0.01 MG tablet Take 1 tablet by mouth daily. 12/06/21   Tresea Mall, CNM  ? ? ?Physical Exam ?Vitals: Blood pressure 111/72, height 5\' 2"  (1.575 m), weight 165 lb (74.8 kg). ? ?General: NAD ?HEENT: normocephalic, anicteric ?Thyroid: no enlargement, no palpable nodules ?Pulmonary: No increased work of breathing, CTAB ?Cardiovascular: RRR, distal pulses 2+ ?Breast: Breast symmetrical, no tenderness, no palpable nodules or masses, no skin or nipple retraction present, no nipple discharge. Nipple piercings present.  No axillary or supraclavicular lymphadenopathy. ?Abdomen: NABS, soft, non-tender, non-distended.  Umbilicus without lesions.  No hepatomegaly, splenomegaly or masses palpable. No evidence of hernia  ?Genitourinary: ? External: Normal external female genitalia.  Normal urethral meatus, normal Bartholin's and Skene's glands.   ? Vagina: Normal vaginal mucosa, no evidence of prolapse.   ? Cervix: Ectropion  present, friable ?Extremities: no edema, erythema, or tenderness ?Neurologic: Grossly intact ?Psychiatric: mood appropriate, affect full ? ? ?Assessment: 26 y.o. 26 routine annual exam ? ?Plan: ?Problem List Items Addressed This Visit   ?None ?Visit Diagnoses   ? ? Surveillance for birth control, oral contraceptives      ? Relevant Medications  ? Levonorgestrel-Ethinyl Estradiol (ASHLYNA) 0.15-0.03 &0.01 MG tablet  ? ?  ? ? ?1) STI screening  was offered and accepted ? ?2)  ASCCP guidelines and rationale discussed.  Deanna Harris opts for every 3 years screening interval ? ?3) Contraception - the Deanna Harris is currently using  OCP (estrogen/progesterone).  She is happy with her current form of contraception and plans to continue ? ?4) Routine healthcare maintenance including cholesterol, diabetes screening discussed Declines ? ?5) Recommended techniques for managing/coping with stress/anxiety. Return for worsening symptoms. ? ?6) Return in about 1 year (around 12/07/2022) for annual established gyn. ? ? ?02/06/2023, CNM ?[facility] ?[facility] ?12/06/2021, 3:12 PM ? ? ?  ?

## 2021-12-08 LAB — CYTOLOGY - PAP
Chlamydia: NEGATIVE
Comment: NEGATIVE
Comment: NEGATIVE
Comment: NORMAL
Diagnosis: NEGATIVE
Neisseria Gonorrhea: NEGATIVE
Trichomonas: NEGATIVE

## 2021-12-31 DIAGNOSIS — L309 Dermatitis, unspecified: Secondary | ICD-10-CM | POA: Diagnosis not present

## 2021-12-31 DIAGNOSIS — J45909 Unspecified asthma, uncomplicated: Secondary | ICD-10-CM | POA: Diagnosis not present

## 2021-12-31 DIAGNOSIS — R109 Unspecified abdominal pain: Secondary | ICD-10-CM | POA: Diagnosis not present

## 2021-12-31 DIAGNOSIS — R350 Frequency of micturition: Secondary | ICD-10-CM | POA: Diagnosis not present

## 2021-12-31 DIAGNOSIS — N3001 Acute cystitis with hematuria: Secondary | ICD-10-CM | POA: Diagnosis not present

## 2021-12-31 DIAGNOSIS — N3091 Cystitis, unspecified with hematuria: Secondary | ICD-10-CM | POA: Diagnosis not present

## 2021-12-31 DIAGNOSIS — Z7289 Other problems related to lifestyle: Secondary | ICD-10-CM | POA: Diagnosis not present

## 2021-12-31 DIAGNOSIS — R1030 Lower abdominal pain, unspecified: Secondary | ICD-10-CM | POA: Diagnosis not present

## 2021-12-31 DIAGNOSIS — R9341 Abnormal radiologic findings on diagnostic imaging of renal pelvis, ureter, or bladder: Secondary | ICD-10-CM | POA: Diagnosis not present

## 2021-12-31 DIAGNOSIS — R300 Dysuria: Secondary | ICD-10-CM | POA: Diagnosis not present

## 2022-01-06 DIAGNOSIS — Z419 Encounter for procedure for purposes other than remedying health state, unspecified: Secondary | ICD-10-CM | POA: Diagnosis not present

## 2022-02-05 DIAGNOSIS — Z419 Encounter for procedure for purposes other than remedying health state, unspecified: Secondary | ICD-10-CM | POA: Diagnosis not present

## 2022-02-28 ENCOUNTER — Ambulatory Visit
Admission: EM | Admit: 2022-02-28 | Discharge: 2022-02-28 | Disposition: A | Discharge status: Home / Self Care | Payer: Medicaid Other

## 2022-02-28 DIAGNOSIS — H01131 Eczematous dermatitis of right upper eyelid: Secondary | ICD-10-CM

## 2022-02-28 DIAGNOSIS — H01134 Eczematous dermatitis of left upper eyelid: Secondary | ICD-10-CM | POA: Diagnosis not present

## 2022-02-28 MED ORDER — TRIAMCINOLONE ACETONIDE 0.1 % EX CREA: 1.0000 "application " | TOPICAL_CREAM | Freq: Two times a day (BID) | CUTANEOUS | 0 refills | Status: DC

## 2022-02-28 NOTE — ED Provider Notes (Signed)
[facility]    CSN: [id number] Arrival date & time: 02/28/22  1332      History   Chief Complaint Chief Complaint  Patient presents with   Allergies    HPI Deanna Harris is a 26 y.o. female.   HPI  26 year old female here for evaluation of allergy complaints.  Patient reports that she believes her allergies are flaring up despite her current use of Benadryl, Claritin, and Nasacort.  She reports that her most significant complaint is red itchy upper eyelids that are swollen in the morning.  She also has some dryness in her nasal passages and a scratchy throat.  Additionally, she has fine red rash on the back of her left forearm and between the fingers on her right hand.  Past Medical History:  Diagnosis Date   Asthma    Atypical squamous cell changes of undetermined significance (ASCUS) on cervical cytology with positive high risk human papilloma virus (HPV) 04/09/2017   Migraine headache with aura    Ovarian cyst 09/2013   left side   Postpartum care following vaginal delivery 02/18/2018   Supervision of other normal pregnancy, antepartum 07/25/2017   [facility]inic [facility] Prenatal Labs Dating 10 wk u/s Blood type: AB/Positive/-- (10/18 1519)  Genetic Screen 1 Screen: neg        Antibody:Negative (10/18 1519) Anatomic [place] NORMAL FEMALE Rubella: 1.38 (10/18 1519)  Varicella: immune GTT 128 RPR: Non Reactive (10/18 1519)  Rhogam n/a HBsAg: Negative (10/18 1519)  TDaP vaccine 01/03/2018 HIV:   Neg Baby Food Bottle                       GBS:  Contraception    Patient Active Problem List   Diagnosis Date Noted   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 Authorizing Provider  Levonorgestrel-Ethinyl Estradiol (ASHLYNA) 0.15-0.03 &0.01 MG tablet Take 1 tablet by mouth daily. 12/06/21  Yes Tresea Mall, CNM  triamcinolone cream (KENALOG) 0.1 % Apply 1 application. topically 2 (two) times daily. 02/28/22  Yes Becky Augusta, NP  nitrofurantoin, macrocrystal-monohydrate, (MACROBID) 100 MG capsule Take 100 mg by mouth 2 (two) times daily. 01/01/22   [provider]    Family History Family History  Problem Relation Age of Onset   Diabetes Mother        ?CHTN   Lung cancer Maternal Aunt    Lung cancer Maternal Grandmother    Heart attack Maternal Grandfather    Pancreatic cancer Other     Social History Social History   Tobacco Use   Smoking status: Never  Smokeless tobacco: Never  Vaping Use   Vaping Use: Never used  Substance Use Topics   Alcohol use: No   Drug use: No     Allergies   Vancomycin and Tramadol   Review of Systems Review of Systems  Constitutional:  Negative for fever.  HENT:  Positive for rhinorrhea and sore throat.   Eyes:  Positive for redness and itching. Negative for pain.       Eyelids are red and itchy.  Eyeballs are asymptomatic.  Skin:  Positive for rash.  Hematological: Negative.   Psychiatric/Behavioral: Negative.      Physical Exam Triage Vital Signs ED Triage Vitals  Enc Vitals Group     BP --      Pulse --      Resp --      Temp --      Temp src --      SpO2 --      Weight 02/28/22 1354 150 lb (68 kg)     Height 02/28/22 1354 5\' 3"  (1.6 m)     Head Circumference --      Peak Flow --      Pain Score 02/28/22 1353 0     Pain Loc --      Pain Edu? --      Excl. in GC? --    No data found.  Updated  Vital Signs BP 113/81 (BP Location: Left Arm)   Pulse 79   Temp 98.4 F (36.9 C) (Oral)   Resp 18   Ht 5\' 3"  (1.6 m)   Wt 150 lb (68 kg)   LMP  (LMP Unknown)   SpO2 100%   BMI 26.57 kg/m   Visual Acuity Right Eye Distance:   Left Eye Distance:   Bilateral Distance:    Right Eye Near:   Left Eye Near:    Bilateral Near:     Physical Exam Vitals and nursing note reviewed.  Constitutional:      Appearance: Normal appearance. She is not ill-appearing.  HENT:     Head: Normocephalic and atraumatic.     Nose: Congestion and rhinorrhea present.     Mouth/Throat:     Mouth: Mucous membranes are moist.     Pharynx: Oropharynx is clear. Posterior oropharyngeal erythema present.  Eyes:     Extraocular Movements: Extraocular movements intact.     Pupils: Pupils are equal, round, and reactive to light.     Comments: Upper eyelids on the medial aspect bilaterally are mildly erythematous with some fine scale.  The eyelid themselves are nonedematous.  Pupils are equal round reactive and EOMs intact.  Skin:    General: Skin is warm and dry.     Capillary Refill: Capillary refill takes less than 2 seconds.     Findings: Rash present.  Neurological:     General: No focal deficit present.     Mental Status: She is alert and oriented to person, place, and time.  Psychiatric:        Mood and Affect: Mood normal.        Behavior: Behavior normal.        Thought Content: Thought content normal.        Judgment: Judgment normal.     UC Treatments / Results  Labs (all labs ordered are listed, but only abnormal results are displayed) Labs Reviewed - No data to display  EKG   Radiology No results found.  Procedures Procedures (including critical care time)  Medications Ordered in UC Medications - No data to display  Initial Impression / Assessment and Plan / UC Course  I have reviewed the triage vital signs and the nursing notes.  Pertinent labs & imaging results that were  available during my care of the patient were reviewed by me and considered in my medical decision making (see chart for details).  Is a nontoxic-appearing 26 year old female here for evaluation of allergy complaints as outlined HPI above.  On exam patient's bilateral upper eyelids have some erythema with a scaly texture near the inner canthus bilaterally.  The eyelid some cells are nonedematous and they are not tender to touch.  Bulbar and labral conjunctiva are unremarkable.  Pupils equal and reactive and EOMs intact.  Nasal mucosa is mildly edematous with pale mucosa and clear nasal discharge.  Oropharyngeal exam reveals mild posterior pharyngeal erythema with clear postnasal drip.  The rash on the back of the patient's left arm and between the fingers of her right hand are maculopapular erythematous lesions that resemble atopic dermatitis.  The skin on the eyelids resembles eczema.  I will prescribe the patient low-dose triamcinolone and have her apply it twice daily for a week to see if her symptoms improve.  If they do not she should follow-up with dermatology for her skin complaints.  For her allergy complaints she should continue to use her Nasacort, Claritin, and Benadryl.   Final Clinical Impressions(s) / UC Diagnoses   Final diagnoses:  Eczematous dermatitis of upper eyelids of both eyes     Discharge Instructions      Apply the triamcinolone ointment twice daily to your eyelids and rash on arms and fingers.   Continue to use Claritin during the day and Zyrtec at bedtime as needed for itching.      ED Prescriptions     Medication Sig Dispense Auth. Provider   triamcinolone cream (KENALOG) 0.1 % Apply 1 application. topically 2 (two) times daily. 30 g Becky Augusta, NP      PDMP not reviewed this encounter.   Becky Augusta, NP 02/28/22 1425

## 2022-02-28 NOTE — Discharge Instructions (Addendum)
Apply the triamcinolone ointment twice daily to your eyelids and rash on arms and fingers.   Continue to use Claritin during the day and Zyrtec at bedtime as needed for itching.

## 2022-02-28 NOTE — ED Triage Notes (Signed)
Patient is here or "maybe allergy flareups". Throat is itchy, nose is dry, eyelids are dry "not eyes". Hives/rash "showing up in various areas". No fever. No congestion. "Clear runny nose".

## 2022-03-08 DIAGNOSIS — Z419 Encounter for procedure for purposes other than remedying health state, unspecified: Secondary | ICD-10-CM | POA: Diagnosis not present

## 2022-04-07 DIAGNOSIS — Z419 Encounter for procedure for purposes other than remedying health state, unspecified: Secondary | ICD-10-CM | POA: Diagnosis not present

## 2022-05-08 DIAGNOSIS — Z419 Encounter for procedure for purposes other than remedying health state, unspecified: Secondary | ICD-10-CM | POA: Diagnosis not present

## 2022-06-08 DIAGNOSIS — Z419 Encounter for procedure for purposes other than remedying health state, unspecified: Secondary | ICD-10-CM | POA: Diagnosis not present

## 2022-07-08 DIAGNOSIS — Z419 Encounter for procedure for purposes other than remedying health state, unspecified: Secondary | ICD-10-CM | POA: Diagnosis not present

## 2022-08-04 ENCOUNTER — Encounter: Payer: Self-pay | Admitting: Emergency Medicine

## 2022-08-04 ENCOUNTER — Ambulatory Visit
Admission: EM | Admit: 2022-08-04 | Discharge: 2022-08-04 | Disposition: A | Discharge status: Home / Self Care | Payer: Medicaid Other | Attending: Physician Assistant | Admitting: Physician Assistant

## 2022-08-04 DIAGNOSIS — T7840XA Allergy, unspecified, initial encounter: Secondary | ICD-10-CM

## 2022-08-04 DIAGNOSIS — Z1152 Encounter for screening for COVID-19: Secondary | ICD-10-CM | POA: Diagnosis not present

## 2022-08-04 LAB — SARS CORONAVIRUS 2 BY RT PCR: SARS Coronavirus 2 by RT PCR: NEGATIVE

## 2022-08-04 MED ORDER — ALBUTEROL SULFATE HFA 108 (90 BASE) MCG/ACT IN AERS: 1.0000 | INHALATION_SPRAY | Freq: Four times a day (QID) | RESPIRATORY_TRACT | 0 refills | Status: DC | PRN

## 2022-08-04 MED ORDER — MONTELUKAST SODIUM 10 MG PO TABS: 10.0000 mg | ORAL_TABLET | Freq: Every day | ORAL | 1 refills | Status: DC

## 2022-08-04 NOTE — ED Provider Notes (Signed)
[facility] - [place], [place]   Name: Deanna Harris DOB: 1996-01-04 MRN: [id number] CSN: [id number] PCP: Patient, No Pcp Per  Arrival date and time:  08/04/22 1503  Chief Complaint:  Cough   NOTE: Prior to seeing the patient today, I have reviewed the triage nursing documentation and vital signs. Clinical staff has updated patient's PMH/PSHx, current medication list, and drug allergies/intolerances to ensure comprehensive history available to assist in medical decision making.   History:   HPI: Deanna Harris is a 26 y.o. female who presents today with complaints of cough x 48 hrs. She she denies fevers chills or body aches.  She started noticing that this cough is worse at nighttime when laying flat.  She has tried over-the-counter Mucinex and rotating between antihistamines and lip noticed little to no relief.   Past Medical History:  Diagnosis Date   Asthma    Atypical squamous cell changes of undetermined significance (ASCUS) on cervical cytology with positive high risk human papilloma virus (HPV) 04/09/2017   Migraine headache with aura    Ovarian cyst 09/2013   left side   Postpartum care following vaginal delivery 02/18/2018   Supervision of other normal pregnancy, antepartum 07/25/2017   [facility]inic [facility] Prenatal Labs Dating 10 wk u/s Blood type: AB/Positive/-- (10/18 1519)  Genetic Screen 1 Screen: neg        Antibody:Negative (10/18 1519) Anatomic [place] NORMAL FEMALE Rubella: 1.38 (10/18 1519)  Varicella: immune GTT 128 RPR: Non Reactive (10/18 1519)  Rhogam n/a HBsAg: Negative (10/18 1519)  TDaP vaccine 01/03/2018 HIV:   Neg Baby Food Bottle                       GBS:  Contraception    Past Surgical History:  Procedure Laterality Date   CHOLECYSTECTOMY  04/24/2011   DILATION AND CURETTAGE OF UTERUS     for RPOC after elective AB    Family History  Problem Relation Age of Onset   Diabetes Mother        ?CHTN   Lung cancer Maternal Aunt    Lung cancer  Maternal Grandmother    Heart attack Maternal Grandfather    Pancreatic cancer Other     Social History   Tobacco Use   Smoking status: Never   Smokeless tobacco: Never  Vaping Use   Vaping Use: Never used  Substance Use Topics   Alcohol use: No   Drug use: No    Patient Active Problem List   Diagnosis Date Noted   Carpal tunnel syndrome of left wrist 08/23/2021   Cubital tunnel syndrome on left 08/23/2021   Migraine without aura and responsive to treatment 03/17/2018   Chronic pelvic pain in female 04/09/2017    Home Medications:    Current Meds  Medication Sig   albuterol (VENTOLIN HFA) 108 (90 Base) MCG/ACT inhaler Inhale 1-2 puffs into the lungs every 6 (six) hours as needed for wheezing or shortness of breath.   Levonorgestrel-Ethinyl Estradiol (ASHLYNA) 0.15-0.03 &0.01 MG tablet Take 1 tablet by mouth daily.   montelukast (SINGULAIR) 10 MG tablet Take 1 tablet (10 mg total) by mouth at bedtime.    Allergies:   Vancomycin and Tramadol  Review of Systems (ROS): Review of Systems  Constitutional:  Negative for activity change, appetite change, chills, diaphoresis and fatigue.  HENT:  Positive for postnasal drip and sore throat. Negative for congestion, sinus pain and sneezing.   Respiratory:  Positive for cough  and shortness of breath. Negative for wheezing.   Cardiovascular:  Negative for chest pain.  Gastrointestinal:  Negative for diarrhea, nausea and vomiting.  All other systems reviewed and are negative.    Vital Signs: Today's Vitals   08/04/22 1515 08/04/22 1518  BP:  134/82  Pulse:  79  Resp:  14  Temp:  98.6 F (37 C)  TempSrc:  Oral  SpO2:  99%  Weight: 163 lb (73.9 kg)   Height: 5\' 3"  (1.6 m)   PainSc: 5      Physical Exam: Physical Exam Vitals and nursing note reviewed.  Constitutional:      General: She is not in acute distress.    Appearance: She is well-developed.  HENT:     Head: Normocephalic and atraumatic.     Right Ear:  Tympanic membrane normal. No middle ear effusion.     Left Ear: Tympanic membrane normal.  No middle ear effusion.     Nose:     Right Turbinates: Enlarged.     Left Turbinates: Enlarged.     Mouth/Throat:     Pharynx: Posterior oropharyngeal erythema present.     Tonsils: No tonsillar exudate or tonsillar abscesses.  Eyes:     Conjunctiva/sclera: Conjunctivae normal.  Cardiovascular:     Rate and Rhythm: Normal rate and regular rhythm.     Heart sounds: No murmur heard. Pulmonary:     Effort: Pulmonary effort is normal. No respiratory distress.     Breath sounds: Normal breath sounds.  Abdominal:     Palpations: Abdomen is soft.     Tenderness: There is no abdominal tenderness.  Musculoskeletal:     Cervical back: Neck supple.  Lymphadenopathy:     Cervical: No cervical adenopathy.  Skin:    General: Skin is warm and dry.  Neurological:     Mental Status: She is alert.      Urgent Care Treatments / Results:   LABS: PLEASE NOTE: all labs that were ordered this encounter are listed, however only abnormal results are displayed. Labs Reviewed  SARS CORONAVIRUS 2 BY RT PCR    EKG: -None  RADIOLOGY: No results found.  PROCEDURES: Procedures  MEDICATIONS RECEIVED THIS VISIT: Medications - No data to display  PERTINENT CLINICAL COURSE NOTES/UPDATES:   Initial Impression / Assessment and Plan / Urgent Care Course:  Pertinent labs & imaging results that were available during my care of the patient were personally reviewed by me and considered in my medical decision making (see lab/imaging section of note for values and interpretations).  Deanna Harris is a 26 y.o. female who presents to [facility] today with complaints of cough, diagnosed with allergies, and treated as such with the medications below. NP and patient reviewed discharge instructions below during visit.  Printed copy of her COVID test given to patient per request.  Patient is well appearing  overall in clinic today. She does not appear to be in any acute distress. Presenting symptoms (see HPI) and exam as documented above.   I have reviewed the follow up and strict return precautions for any new or worsening symptoms. Patient is aware of symptoms that would be deemed urgent/emergent, and would thus require further evaluation either here or in the emergency department. At the time of discharge, she verbalized understanding and consent with the discharge plan as it was reviewed with her. All questions were fielded by provider and/or clinic staff prior to patient discharge.    Final Clinical Impressions /  Urgent Care Diagnoses:   Final diagnoses:  Allergy, initial encounter    New Prescriptions:  [place] Controlled Substance Registry consulted? Not Applicable  Meds ordered this encounter  Medications   montelukast (SINGULAIR) 10 MG tablet    Sig: Take 1 tablet (10 mg total) by mouth at bedtime.    Dispense:  14 tablet    Refill:  1   albuterol (VENTOLIN HFA) 108 (90 Base) MCG/ACT inhaler    Sig: Inhale 1-2 puffs into the lungs every 6 (six) hours as needed for wheezing or shortness of breath.    Dispense:  6.7 g    Refill:  0      Discharge Instructions      You were seen for cough and are being treated for allergies.   - Use antihistamines, such as cetirizine (Zyrtec) or loratadine (Claritin), to help with the drainage.  -Additional Benadryl can be used at nighttime. - Over-the-counter Flonase 2 times a day will also help with sinus pressure.   -Take the Singulair as directed. -The inhaler can be used if you get into coughing fits that cause wheezing.  Take care, Dr. Marland Kitchen, NP-c     Recommended Follow up Care:  Patient encouraged to follow up with the following provider within the specified time frame, or sooner as dictated by the severity of her symptoms. As always, she was instructed that for any urgent/emergent care needs, she should seek care either here or  in the emergency department for more immediate evaluation.   Gertie Baron, DNP, NP-c   Gertie Baron, NP 08/04/22 [phone]

## 2022-08-04 NOTE — ED Triage Notes (Signed)
Patient c/o cough, chest congestion and HA that started on Thursday.  Patient states cough is worse at night.  Patient needs covid test for work. Patient reports low grade fever yesterday.

## 2022-08-04 NOTE — Discharge Instructions (Signed)
You were seen for cough and are being treated for allergies.   - Use antihistamines, such as cetirizine (Zyrtec) or loratadine (Claritin), to help with the drainage.  -Additional Benadryl can be used at nighttime. - Over-the-counter Flonase 2 times a day will also help with sinus pressure.   -Take the Singulair as directed. -The inhaler can be used if you get into coughing fits that cause wheezing.  Take care, Dr. Marland Kitchen, NP-c

## 2022-08-08 DIAGNOSIS — Z419 Encounter for procedure for purposes other than remedying health state, unspecified: Secondary | ICD-10-CM | POA: Diagnosis not present

## 2022-09-07 DIAGNOSIS — Z419 Encounter for procedure for purposes other than remedying health state, unspecified: Secondary | ICD-10-CM | POA: Diagnosis not present

## 2022-09-20 DIAGNOSIS — F4320 Adjustment disorder, unspecified: Secondary | ICD-10-CM | POA: Diagnosis not present

## 2022-10-08 DIAGNOSIS — Z419 Encounter for procedure for purposes other than remedying health state, unspecified: Secondary | ICD-10-CM | POA: Diagnosis not present

## 2022-11-08 DIAGNOSIS — Z419 Encounter for procedure for purposes other than remedying health state, unspecified: Secondary | ICD-10-CM | POA: Diagnosis not present

## 2022-12-07 DIAGNOSIS — Z419 Encounter for procedure for purposes other than remedying health state, unspecified: Secondary | ICD-10-CM | POA: Diagnosis not present

## 2022-12-20 ENCOUNTER — Telehealth: Payer: Self-pay | Admitting: Advanced Practice Midwife

## 2022-12-20 DIAGNOSIS — Z3041 Encounter for surveillance of contraceptive pills: Secondary | ICD-10-CM

## 2022-12-20 MED ORDER — LEVONORGEST-ETH ESTRAD 91-DAY 0.15-0.03 &0.01 MG PO TABS: 1.0000 | ORAL_TABLET | Freq: Every day | ORAL | 0 refills | Status: DC

## 2022-12-20 NOTE — Telephone Encounter (Signed)
The patient is requesting refill on Levonorgestrel-Ethinyl Estradiol (ASHLYNA) 0.15-0.03 &0.01 MG tablet . The patient is scheduled for annual with [facility]

## 2023-01-07 DIAGNOSIS — Z419 Encounter for procedure for purposes other than remedying health state, unspecified: Secondary | ICD-10-CM | POA: Diagnosis not present

## 2023-02-06 DIAGNOSIS — Z419 Encounter for procedure for purposes other than remedying health state, unspecified: Secondary | ICD-10-CM | POA: Diagnosis not present

## 2023-02-07 ENCOUNTER — Ambulatory Visit: Payer: Medicaid Other | Admitting: Obstetrics and Gynecology

## 2023-02-15 ENCOUNTER — Ambulatory Visit: Payer: Medicaid Other | Admitting: Advanced Practice Midwife

## 2023-03-19 ENCOUNTER — Other Ambulatory Visit: Payer: Self-pay | Admitting: Obstetrics and Gynecology

## 2023-03-19 DIAGNOSIS — Z3041 Encounter for surveillance of contraceptive pills: Secondary | ICD-10-CM

## 2023-03-25 NOTE — Telephone Encounter (Signed)
Can do #28, RF0. Thx.

## 2023-03-26 ENCOUNTER — Other Ambulatory Visit: Payer: Self-pay | Admitting: Obstetrics and Gynecology

## 2023-03-26 ENCOUNTER — Encounter: Payer: Self-pay | Admitting: Advanced Practice Midwife

## 2023-03-26 DIAGNOSIS — Z3041 Encounter for surveillance of contraceptive pills: Secondary | ICD-10-CM

## 2023-03-26 MED ORDER — ASHLYNA 0.15-0.03 &0.01 MG PO TABS: 1.0000 | ORAL_TABLET | Freq: Every day | ORAL | 0 refills | Status: DC

## 2023-03-26 NOTE — Progress Notes (Signed)
Rx RF brand only Ashlyna per pt request

## 2023-04-01 ENCOUNTER — Ambulatory Visit: Payer: Self-pay | Admitting: Advanced Practice Midwife

## 2023-04-08 DIAGNOSIS — Z419 Encounter for procedure for purposes other than remedying health state, unspecified: Secondary | ICD-10-CM | POA: Diagnosis not present

## 2023-05-02 ENCOUNTER — Ambulatory Visit: Payer: Medicaid Other | Admitting: Advanced Practice Midwife

## 2023-05-09 DIAGNOSIS — Z419 Encounter for procedure for purposes other than remedying health state, unspecified: Secondary | ICD-10-CM | POA: Diagnosis not present

## 2023-05-29 DIAGNOSIS — G5621 Lesion of ulnar nerve, right upper limb: Secondary | ICD-10-CM | POA: Diagnosis not present

## 2023-05-29 DIAGNOSIS — G5601 Carpal tunnel syndrome, right upper limb: Secondary | ICD-10-CM | POA: Insufficient documentation

## 2023-06-09 DIAGNOSIS — Z419 Encounter for procedure for purposes other than remedying health state, unspecified: Secondary | ICD-10-CM | POA: Diagnosis not present

## 2023-06-18 ENCOUNTER — Other Ambulatory Visit: Payer: Self-pay | Admitting: Obstetrics and Gynecology

## 2023-06-18 DIAGNOSIS — Z3041 Encounter for surveillance of contraceptive pills: Secondary | ICD-10-CM

## 2023-06-19 MED ORDER — ASHLYNA 0.15-0.03 &0.01 MG PO TABS: 1.0000 | ORAL_TABLET | Freq: Every day | ORAL | 0 refills | Status: DC

## 2023-06-20 ENCOUNTER — Ambulatory Visit (INDEPENDENT_AMBULATORY_CARE_PROVIDER_SITE_OTHER): Payer: Medicaid Other | Admitting: Advanced Practice Midwife

## 2023-06-20 ENCOUNTER — Encounter: Payer: Self-pay | Admitting: Advanced Practice Midwife

## 2023-06-20 VITALS — BP 104/63 | HR 77 | Ht 63.0 in | Wt 162.0 lb

## 2023-06-20 DIAGNOSIS — L659 Nonscarring hair loss, unspecified: Secondary | ICD-10-CM | POA: Diagnosis not present

## 2023-06-20 DIAGNOSIS — Z131 Encounter for screening for diabetes mellitus: Secondary | ICD-10-CM | POA: Diagnosis not present

## 2023-06-20 DIAGNOSIS — R6889 Other general symptoms and signs: Secondary | ICD-10-CM | POA: Diagnosis not present

## 2023-06-20 DIAGNOSIS — Z3041 Encounter for surveillance of contraceptive pills: Secondary | ICD-10-CM

## 2023-06-20 DIAGNOSIS — R102 Pelvic and perineal pain: Secondary | ICD-10-CM

## 2023-06-20 DIAGNOSIS — Z3202 Encounter for pregnancy test, result negative: Secondary | ICD-10-CM

## 2023-06-20 DIAGNOSIS — Z01419 Encounter for gynecological examination (general) (routine) without abnormal findings: Secondary | ICD-10-CM | POA: Diagnosis not present

## 2023-06-20 LAB — POCT URINE PREGNANCY: Preg Test, Ur: NEGATIVE

## 2023-06-20 MED ORDER — ASHLYNA 0.15-0.03 &0.01 MG PO TABS: 1.0000 | ORAL_TABLET | Freq: Every day | ORAL | 4 refills | Status: DC

## 2023-06-20 NOTE — Patient Instructions (Signed)
Hypothyroidism  Hypothyroidism is when the thyroid gland does not make enough of certain hormones. This is called an underactive thyroid. The thyroid gland is a small gland located in the lower front part of the neck, just in front of the windpipe (trachea). This gland makes hormones that help control how the body uses food for energy (metabolism) as well as how the heart and brain function. These hormones also play a role in keeping your bones strong. When the thyroid is underactive, it produces too little of the hormones thyroxine (T4) and triiodothyronine (T3). What are the causes? This condition may be caused by: Hashimoto's disease. This is a disease in which the body's disease-fighting system (immune system) attacks the thyroid gland. This is the most common cause. Viral infections. Pregnancy. Certain medicines. Birth defects. Problems with a gland in the center of the brain (pituitary gland). Lack of enough iodine in the diet. Other causes may include: Past radiation treatments to the head or neck for cancer. Past treatment with radioactive iodine. Past exposure to radiation in the environment. Past surgical removal of part or all of the thyroid. What increases the risk? You are more likely to develop this condition if: You are female. You have a family history of thyroid conditions. You use a medicine called lithium. You take medicines that affect the immune system (immunosuppressants). What are the signs or symptoms? Common symptoms of this condition include: Not being able to tolerate cold. Feeling as though you have no energy (lethargy). Lack of appetite. Constipation. Sadness or depression. Weight gain that is not explained by a change in diet or exercise habits. Menstrual irregularity. Dry skin, coarse hair, or brittle nails. Other symptoms may include: Muscle pain. Slowing of thought processes. Poor memory. How is this diagnosed? This condition may be diagnosed  based on: Your symptoms, your medical history, and a physical exam. Blood tests. You may also have imaging tests, such as an ultrasound or MRI. How is this treated? This condition is treated with medicine that replaces the thyroid hormones that your body does not make. After you begin treatment, it may take several weeks for symptoms to go away. Follow these instructions at home: Take over-the-counter and prescription medicines only as told by your health care provider. If you start taking any new medicines, tell your health care provider. Keep all follow-up visits as told by your health care provider. This is important. As your condition improves, your dosage of thyroid hormone medicine may change. You will need to have blood tests regularly so that your health care provider can monitor your condition. Contact a health care provider if: Your symptoms do not get better with treatment. You are taking thyroid hormone replacement medicine and you: Sweat a lot. Have tremors. Feel anxious. Lose weight rapidly. Cannot tolerate heat. Have emotional swings. Have diarrhea. Feel weak. Get help right away if: You have chest pain. You have an irregular heartbeat. You have a rapid heartbeat. You have difficulty breathing. These symptoms may be an emergency. Get help right away. Call 911. Do not wait to see if the symptoms will go away. Do not drive yourself to the hospital. Summary Hypothyroidism is when the thyroid gland does not make enough of certain hormones (it is underactive). When the thyroid is underactive, it produces too little of the hormones thyroxine (T4) and triiodothyronine (T3). The most common cause is Hashimoto's disease, a disease in which the body's disease-fighting system (immune system) attacks the thyroid gland. The condition can also be caused  by viral infections, medicine, pregnancy, or past radiation treatment to the head or neck. Symptoms may include weight gain, dry  skin, constipation, feeling as though you do not have energy, and not being able to tolerate cold. This condition is treated with medicine to replace the thyroid hormones that your body does not make. This information is not intended to replace advice given to you by your health care provider. Make sure you discuss any questions you have with your health care provider. Document Revised: 09/26/2021 Document Reviewed: 09/26/2021 Elsevier Patient Education  2024 [employer]. Breast Tenderness Breast tenderness is a common problem for women of all ages, but may also occur in men. Breast tenderness has many possible causes, including hormone changes, infections, taking certain medicines, and caffeine intake. In women, the pain usually comes and goes with the menstrual cycle, but it can also be constant. Breast tenderness may range from mild discomfort to severe pain. You may have tests, such as a mammogram or an ultrasound, to check for any unusual findings. Having breast tenderness usually does not mean that you have breast cancer. Follow these instructions at home: Managing pain and discomfort  If directed, put ice on the painful area. To do this: Put ice in a plastic bag. Place a towel between your skin and the bag. Leave the ice on for 20 minutes, 2-3 times a day. If your skin turns bright red, remove the ice right away to prevent skin damage. The risk of skin damage is higher if you cannot feel pain, heat, or cold. Wear a supportive bra or chest support: During exercise. While sleeping, if your breasts are very tender. Medicines Take over-the-counter and prescription medicines only as told by your health care provider. If the cause of your pain is an infection, you may be prescribed an antibiotic medicine. If you were prescribed antibiotics, take them as told by your health care provider. Do not stop using the antibiotic even if you start to feel better. Eating and drinking Decrease the  amount of caffeine in your diet. Instead, drink more water and choose caffeine-free drinks. Your health care provider may recommend that you lessen the amount of fat in your diet. You can do this by: Limiting fried foods. Cooking foods using methods such as baking, boiling, grilling, and broiling. General instructions  Keep a log of the days and times when your breasts are most tender. Ask your health care provider how to do breast exams at home. This will help you notice if you have an unusual growth or lump. Keep all follow-up visits. Contact a health care provider if: Any part of your breast is hard, red, and hot to the touch. This may be a sign of infection. You are a woman and have a new or painful lump in your breast that remains after your menstrual period ends. You are not breastfeeding and you have fluid, especially blood or pus, coming out of your nipples. You have a fever. Your pain does not improve or it gets worse. Your pain is interfering with your daily activities. Summary Breast tenderness may range from mild discomfort to severe pain. Breast tenderness has many possible causes, including hormone changes, infections, taking certain medicines, and caffeine intake. It can be treated with ice, wearing a supportive bra or chest support, and medicines. Make changes to your diet as told by your health care provider. This information is not intended to replace advice given to you by your health care provider. Make sure you discuss  any questions you have with your health care provider. Document Revised: 12/06/2021 Document Reviewed: 12/06/2021 Elsevier Patient Education  2024 [employer].

## 2023-06-20 NOTE — Progress Notes (Signed)
[facility]   Gynecology Annual Exam   PCP: Patient, No Pcp Per  Chief Complaint: Annual exam   History of Present Illness: Patient is a 27 y.o. [id number] presents for annual exam. The patient has complaint today of left sided pelvic pain. She has the pain once or twice a month and it lasts for a few minutes to a few days. Sometimes the pain radiates to her back. The pain happens at random times. She denies vaginal or urinary symptoms. She mentions worsening anxiety recently due to the unexpected death of her mother. She also has concerns of cold intolerance and hair loss. She reports a family history of thyroid dysfunction. She has upcoming carpal/cubital tunnel surgery on her right arm.  LMP: Patient's last menstrual period was 04/24/2023. Average Interval: regular, every 3 months Duration of flow: 4 days Heavy Menses: no Clots: no Intermenstrual Bleeding: no Postcoital Bleeding: no Dysmenorrhea: no  The patient is sexually active. She currently uses OCP (estrogen/progesterone) for contraception. She denies dyspareunia.  The patient does perform self breast exams. She has some breast tenderness recently- mostly when she takes her bra off. There is no notable family history of breast or ovarian cancer in her family.  The patient wears seatbelts: yes.   The patient has regular exercise:  she is active with her children. She admits healthy diet and adequate hydration and sleep .    The patient denies current symptoms of depression.  She reports good coping ability.  Review of Systems: Review of Systems  Constitutional:  Negative for chills and fever.  HENT:  Negative for congestion, ear discharge, ear pain, hearing loss, sinus pain and sore throat.   Eyes:  Negative for blurred vision and double vision.  Respiratory:  Negative for cough, shortness of breath and wheezing.   Cardiovascular:  Negative for chest pain, palpitations and leg swelling.  Gastrointestinal:  Negative for  abdominal pain, blood in stool, constipation, diarrhea, heartburn, melena, nausea and vomiting.  Genitourinary:  Negative for dysuria, flank pain, frequency, hematuria and urgency.  Musculoskeletal:  Negative for back pain, joint pain and myalgias.  Skin:  Negative for itching and rash.  Neurological:  Positive for dizziness, tingling and headaches. Negative for tremors, sensory change, speech change, focal weakness, seizures, loss of consciousness and weakness.  Endo/Heme/Allergies:  Negative for environmental allergies. Does not bruise/bleed easily.       Positive for cold intolerance, hair loss  Psychiatric/Behavioral:  Negative for depression, hallucinations, memory loss, substance abuse and suicidal ideas. The patient is not nervous/anxious and does not have insomnia.        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 Added automatically from request for surgery [id number]    Cubital tunnel syndrome on left 08/23/2021     Formatting of this note might be different from the original. Added automatically from request for surgery [id number]    Migraine without aura and responsive to treatment 03/17/2018    Chronic pelvic pain in female 04/09/2017     Past Surgical History:  Past Surgical History:  Procedure Laterality Date   CHOLECYSTECTOMY  04/24/2011   DILATION AND CURETTAGE OF UTERUS  for [facility] after elective AB    Gynecologic History:  Patient's last menstrual period was 04/24/2023. Contraception: OCP (estrogen/progesterone) Last Pap: 2023 Results were: no abnormalities   Obstetric History: [id number]  Family History:  Family History  Problem Relation Age of Onset    Diabetes Mother        ?CHTN   Lung cancer Maternal Aunt    Lung cancer Maternal Grandmother    Heart attack Maternal Grandfather    Pancreatic cancer Other     Social History:  Social History   Socioeconomic History   Marital status: Single    Spouse name: Not on file   Number of children: 2   Years of education: Not on file   Highest education level: Not on file  Occupational History   Occupation: Nurse, children's  Tobacco Use   Smoking status: Never   Smokeless tobacco: Never  Vaping Use   Vaping status: Never Used  Substance and Sexual Activity   Alcohol use: No   Drug use: No   Sexual activity: Yes    Partners: Male    Birth control/protection: Pill  Other Topics Concern   Not on file  Social History Narrative   Not on file   Social Determinants of Health   Financial Resource Strain: Not on file  Food Insecurity: Not on file  Transportation Needs: Not on file  Physical Activity: Not on file  Stress: Not on file  Social Connections: Not on file  Intimate Partner Violence: Not on file    Allergies:  Allergies  Allergen Reactions   Vancomycin Swelling   Tramadol Hives    Medications: Prior to Admission medications   Medication Sig Start Date End Date Taking? Authorizing Provider  ASHLYNA 0.15-0.03 &0.01 MG tablet Take 1 tablet by mouth daily. BRAND ONLY 06/20/23   Tresea Mall, CNM    Physical Exam Vitals: Blood pressure 104/63, pulse 77, height 5\' 3"  (1.6 m), weight 162 lb (73.5 kg), last menstrual period 04/24/2023.  General: NAD HEENT: normocephalic, anicteric Thyroid: no enlargement, no palpable nodules Pulmonary: No increased work of breathing, CTAB Cardiovascular: RRR, distal pulses 2+ Breast: Breast symmetrical, no tenderness, no palpable nodules or masses, no skin or nipple retraction present, no nipple discharge.  No axillary or supraclavicular lymphadenopathy. Abdomen: NABS, soft, non-tender, non-distended.  Umbilicus without lesions.  No  hepatomegaly, splenomegaly or masses palpable. No evidence of hernia  Genitourinary:  External: Normal external female genitalia.  Normal urethral meatus, normal Bartholin's and Skene's glands.    Vagina: Normal vaginal mucosa, no evidence of prolapse.    Cervix: No CMT  Uterus: Non-enlarged, mobile, normal contour.    Adnexa: increased tenderness on left side with possible slight enlargement  Rectal: deferred  Lymphatic: no evidence of inguinal lymphadenopathy Extremities: no edema, erythema, or tenderness Neurologic: Grossly intact Psychiatric: mood appropriate, affect full   Assessment: 27 y.o. [id number] routine annual exam  Plan: Problem List Items Addressed This Visit   None Visit Diagnoses     Well woman exam with routine gynecological exam    -  Primary   Relevant Orders   Thyroid Panel With TSH   CBC with Differential/Platelet   Comprehensive metabolic panel   Hgb A1c w/o eAG   US PELVIS TRANSVAGINAL NON-OB (TV ONLY)   Cold intolerance       Relevant Orders   Thyroid Panel With TSH   Hair loss       Relevant Orders   Thyroid Panel With TSH   Screening  for diabetes mellitus       Relevant Orders   Hgb A1c w/o eAG   Pelvic pain       Relevant Orders   US PELVIS TRANSVAGINAL NON-OB (TV ONLY)   Surveillance for birth control, oral contraceptives       Relevant Medications   ASHLYNA 0.15-0.03 &0.01 MG tablet   Other Relevant Orders   POCT urine pregnancy (Completed)       1) STI screening  was offered and declined  2)  ASCCP guidelines and rationale discussed.  Patient opts for every 3 years screening interval. Next PAP due in 2026  3) Contraception - the patient is currently using  OCP (estrogen/progesterone).  She is happy with her current form of contraception and plans to continue  4) Routine healthcare maintenance including cholesterol, diabetes screening discussed. Routine screening plus TSH panel based on clinical history  5) TV gyn ultrasound for left  side pelvic pain  6) Return in about 1 week (around 06/27/2023) for gyn ultrasound- pelvic pain with telephone follow up after   Tresea Mall, CNM [facility] 06/20/2023 1:14 PM

## 2023-06-21 LAB — COMPREHENSIVE METABOLIC PANEL
ALT: 28 IU/L (ref 0–32)
AST: 22 IU/L (ref 0–40)
Albumin: 4.4 g/dL (ref 4.0–5.0)
Alkaline Phosphatase: 44 IU/L (ref 44–121)
BUN/Creatinine Ratio: 17 (ref 9–23)
BUN: 11 mg/dL (ref 6–20)
Bilirubin Total: 0.5 mg/dL (ref 0.0–1.2)
CO2: 20 mmol/L (ref 20–29)
Calcium: 9.2 mg/dL (ref 8.7–10.2)
Chloride: 104 mmol/L (ref 96–106)
Creatinine, Ser: 0.65 mg/dL (ref 0.57–1.00)
Globulin, Total: 2.3 g/dL (ref 1.5–4.5)
Glucose: 68 mg/dL — ABNORMAL LOW (ref 70–99)
Potassium: 4.1 mmol/L (ref 3.5–5.2)
Sodium: 140 mmol/L (ref 134–144)
Total Protein: 6.7 g/dL (ref 6.0–8.5)
eGFR: 122 mL/min/{1.73_m2} (ref >59)

## 2023-06-21 LAB — HGB A1C W/O EAG: Hgb A1c MFr Bld: 5.4 % (ref 4.8–5.6)

## 2023-06-21 LAB — CBC WITH DIFFERENTIAL/PLATELET
Basophils Absolute: 0 10*3/uL (ref 0.0–0.2)
Basos: 1 %
EOS (ABSOLUTE): 0.1 10*3/uL (ref 0.0–0.4)
Eos: 1 %
Hematocrit: 38.6 % (ref 34.0–46.6)
Hemoglobin: 12.5 g/dL (ref 11.1–15.9)
Immature Grans (Abs): 0 10*3/uL (ref 0.0–0.1)
Immature Granulocytes: 0 %
Lymphocytes Absolute: 1.8 10*3/uL (ref 0.7–3.1)
Lymphs: 26 %
MCH: 28.6 pg (ref 26.6–33.0)
MCHC: 32.4 g/dL (ref 31.5–35.7)
MCV: 88 fL (ref 79–97)
Monocytes Absolute: 0.5 10*3/uL (ref 0.1–0.9)
Monocytes: 7 %
Neutrophils Absolute: 4.6 10*3/uL (ref 1.4–7.0)
Neutrophils: 65 %
Platelets: 264 10*3/uL (ref 150–450)
RBC: 4.37 x10E6/uL (ref 3.77–5.28)
RDW: 12.4 % (ref 11.7–15.4)
WBC: 7 10*3/uL (ref 3.4–10.8)

## 2023-06-21 LAB — THYROID PANEL WITH TSH
Free Thyroxine Index: 2.1 (ref 1.2–4.9)
T3 Uptake Ratio: 21 % — ABNORMAL LOW (ref 24–39)
T4, Total: 9.9 ug/dL (ref 4.5–12.0)
TSH: 2.52 u[IU]/mL (ref 0.450–4.500)

## 2023-07-05 ENCOUNTER — Encounter: Payer: Self-pay | Admitting: Advanced Practice Midwife

## 2023-07-09 DIAGNOSIS — Z419 Encounter for procedure for purposes other than remedying health state, unspecified: Secondary | ICD-10-CM | POA: Diagnosis not present

## 2023-07-11 ENCOUNTER — Ambulatory Visit: Payer: Medicaid Other

## 2023-07-11 ENCOUNTER — Encounter: Payer: Self-pay | Admitting: Advanced Practice Midwife

## 2023-07-11 DIAGNOSIS — R102 Pelvic and perineal pain: Secondary | ICD-10-CM | POA: Diagnosis not present

## 2023-07-11 DIAGNOSIS — Z01419 Encounter for gynecological examination (general) (routine) without abnormal findings: Secondary | ICD-10-CM

## 2023-07-15 ENCOUNTER — Telehealth: Payer: Self-pay

## 2023-07-15 NOTE — Telephone Encounter (Signed)
Called Deanna Harris to ask a few questions about her form she wants [place] to complete

## 2023-07-17 ENCOUNTER — Encounter: Payer: Self-pay | Admitting: Advanced Practice Midwife

## 2023-07-19 ENCOUNTER — Other Ambulatory Visit: Payer: Self-pay | Admitting: Advanced Practice Midwife

## 2023-07-19 DIAGNOSIS — R221 Localized swelling, mass and lump, neck: Secondary | ICD-10-CM

## 2023-07-19 DIAGNOSIS — Z8349 Family history of other endocrine, nutritional and metabolic diseases: Secondary | ICD-10-CM

## 2023-07-19 NOTE — Progress Notes (Signed)
Referral to endocrinology for patient symptoms of neck swelling and discomfort, family history of thyroiditis.

## 2023-07-26 DIAGNOSIS — R7989 Other specified abnormal findings of blood chemistry: Secondary | ICD-10-CM | POA: Diagnosis not present

## 2023-07-26 DIAGNOSIS — Z1159 Encounter for screening for other viral diseases: Secondary | ICD-10-CM | POA: Diagnosis not present

## 2023-07-26 DIAGNOSIS — Z0000 Encounter for general adult medical examination without abnormal findings: Secondary | ICD-10-CM | POA: Diagnosis not present

## 2023-07-26 DIAGNOSIS — Z23 Encounter for immunization: Secondary | ICD-10-CM | POA: Diagnosis not present

## 2023-07-26 DIAGNOSIS — L209 Atopic dermatitis, unspecified: Secondary | ICD-10-CM | POA: Diagnosis not present

## 2023-07-26 DIAGNOSIS — F419 Anxiety disorder, unspecified: Secondary | ICD-10-CM | POA: Diagnosis not present

## 2023-07-26 DIAGNOSIS — R109 Unspecified abdominal pain: Secondary | ICD-10-CM | POA: Diagnosis not present

## 2023-08-06 DIAGNOSIS — E049 Nontoxic goiter, unspecified: Secondary | ICD-10-CM | POA: Diagnosis not present

## 2023-08-06 DIAGNOSIS — Z87442 Personal history of urinary calculi: Secondary | ICD-10-CM | POA: Diagnosis not present

## 2023-08-06 DIAGNOSIS — R109 Unspecified abdominal pain: Secondary | ICD-10-CM | POA: Diagnosis not present

## 2023-08-06 DIAGNOSIS — R7989 Other specified abnormal findings of blood chemistry: Secondary | ICD-10-CM | POA: Diagnosis not present

## 2023-08-09 DIAGNOSIS — Z419 Encounter for procedure for purposes other than remedying health state, unspecified: Secondary | ICD-10-CM | POA: Diagnosis not present

## 2023-08-23 DIAGNOSIS — F419 Anxiety disorder, unspecified: Secondary | ICD-10-CM | POA: Diagnosis not present

## 2023-08-23 DIAGNOSIS — R102 Pelvic and perineal pain: Secondary | ICD-10-CM | POA: Diagnosis not present

## 2023-08-23 DIAGNOSIS — G8929 Other chronic pain: Secondary | ICD-10-CM | POA: Diagnosis not present

## 2023-08-23 DIAGNOSIS — K5909 Other constipation: Secondary | ICD-10-CM | POA: Diagnosis not present

## 2023-08-23 DIAGNOSIS — R109 Unspecified abdominal pain: Secondary | ICD-10-CM | POA: Diagnosis not present

## 2023-09-11 ENCOUNTER — Other Ambulatory Visit: Payer: Self-pay | Admitting: Advanced Practice Midwife

## 2023-09-11 DIAGNOSIS — Z3041 Encounter for surveillance of contraceptive pills: Secondary | ICD-10-CM

## 2023-09-12 ENCOUNTER — Encounter: Payer: Self-pay | Admitting: Advanced Practice Midwife

## 2023-09-12 DIAGNOSIS — Z3041 Encounter for surveillance of contraceptive pills: Secondary | ICD-10-CM

## 2023-09-13 MED ORDER — ASHLYNA 0.15-0.03 &0.01 MG PO TABS: 1.0000 | ORAL_TABLET | Freq: Every day | ORAL | 3 refills | Status: DC

## 2023-11-13 DIAGNOSIS — K429 Umbilical hernia without obstruction or gangrene: Secondary | ICD-10-CM | POA: Insufficient documentation

## 2023-11-13 DIAGNOSIS — M6208 Separation of muscle (nontraumatic), other site: Secondary | ICD-10-CM | POA: Insufficient documentation

## 2023-11-13 DIAGNOSIS — R109 Unspecified abdominal pain: Secondary | ICD-10-CM | POA: Insufficient documentation

## 2023-11-21 ENCOUNTER — Ambulatory Visit
Admission: EM | Admit: 2023-11-21 | Discharge: 2023-11-21 | Disposition: A | Discharge status: Home / Self Care | Payer: Medicaid Other | Attending: Family Medicine | Admitting: Family Medicine

## 2023-11-21 DIAGNOSIS — J111 Influenza due to unidentified influenza virus with other respiratory manifestations: Secondary | ICD-10-CM | POA: Insufficient documentation

## 2023-11-21 LAB — RESP PANEL BY RT-PCR (FLU A&B, COVID) ARPGX2
Influenza A by PCR: NEGATIVE
Influenza B by PCR: NEGATIVE
SARS Coronavirus 2 by RT PCR: NEGATIVE

## 2023-11-21 MED ORDER — ONDANSETRON 4 MG PO TBDP: 4.0000 mg | ORAL_TABLET | Freq: Three times a day (TID) | ORAL | 0 refills | Status: AC | PRN

## 2023-11-21 MED ORDER — ALBUTEROL SULFATE HFA 108 (90 BASE) MCG/ACT IN AERS: 2.0000 | INHALATION_SPRAY | RESPIRATORY_TRACT | 0 refills | Status: AC | PRN

## 2023-11-21 NOTE — ED Provider Notes (Signed)
[facility]    CSN: [id number] Arrival date & time: 11/21/23  0825      History   Chief Complaint Chief Complaint  Patient presents with   Cough   Nausea   Fever    HPI Deanna Harris is a 28 y.o. female.   HPI  History obtained from the patient. Deanna Harris presents for cough, rhinorrhea, sneezing, ear pain, diarrhea, headache, abdominal pain, sore throat, fever, body aches and nausea that started on Tuesday afternoon. She was swabbing patient for the flu on Friday.  Symptoms got bad last night.    Has history of asthma.     Past Medical History:  Diagnosis Date   Asthma    Atypical squamous cell changes of undetermined significance (ASCUS) on cervical cytology with positive high risk human papilloma virus (HPV) 04/09/2017   Migraine headache with aura    Ovarian cyst 09/2013   left side   Postpartum care following vaginal delivery 02/18/2018   Supervision of other normal pregnancy, antepartum 07/25/2017   Clinic [facility] Prenatal Labs Dating 10 wk u/s Blood type: AB/Positive/-- (10/18 1519)  Genetic Screen 1 Screen: neg        Antibody:Negative (10/18 1519) Anatomic [place] NORMAL FEMALE Rubella: 1.38 (10/18 1519)  Varicella: immune GTT 128 RPR: Non Reactive (10/18 1519)  Rhogam n/a HBsAg: Negative (10/18 1519)  TDaP vaccine 01/03/2018 HIV:   Neg Baby Food Bottle                       GBS:  Contraception    Patient Active Problem List   Diagnosis Date Noted   Carpal tunnel syndrome on right 05/29/2023   Cubital tunnel syndrome on right 05/29/2023   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 Authorizing Provider  albuterol (VENTOLIN HFA) 108 (90 Base) MCG/ACT inhaler Inhale 2 puffs into the lungs every 4 (four) hours as needed. 11/21/23  Yes Avedis Bevis, DO  ASHLYNA 0.15-0.03 &0.01 MG tablet Take 1 tablet by mouth daily. 09/13/23  Yes Tresea Mall, CNM  meloxicam (MOBIC) 15 MG tablet Take 15 mg by mouth daily.   Yes [provider]  ondansetron (ZOFRAN-ODT) 4 MG disintegrating tablet Take 1 tablet (4 mg total) by mouth every 8 (eight) hours as needed. 11/21/23  Yes Katha Cabal, DO  propranolol (INDERAL) 10 MG tablet Take by mouth. 10/07/23 10/06/24 Yes [provider]    Family History Family History  Problem Relation Age of Onset   Diabetes Mother        ?CHTN   Lung cancer Maternal Aunt  Lung cancer Maternal Grandmother    Heart attack Maternal Grandfather    Pancreatic cancer Other     Social History Social History   Tobacco Use   Smoking status: Never   Smokeless tobacco: Never  Vaping Use   Vaping status: Never Used  Substance Use Topics   Alcohol use: No   Drug use: No     Allergies   Vancomycin and Tramadol   Review of Systems Review of Systems: negative unless otherwise stated in HPI.      Physical Exam Triage Vital Signs ED Triage Vitals  Encounter Vitals Group     BP      Systolic BP Percentile      Diastolic BP Percentile      Pulse      Resp      Temp      Temp src      SpO2      Weight      Height      Head Circumference      Peak Flow      Pain Score      Pain Loc      Pain Education      Exclude from Growth Chart    No data found.  Updated Vital Signs BP 120/80 (BP Location: Right Arm)   Pulse 68   Temp 98.4 F (36.9 C) (Oral)   Resp 17   SpO2 98%   Visual  Acuity Right Eye Distance:   Left Eye Distance:   Bilateral Distance:    Right Eye Near:   Left Eye Near:    Bilateral Near:     Physical Exam GEN:     alert, non-toxic appearing female in no distress    HENT:  mucus membranes moist, oropharyngeal without lesions or erythema, no tonsillar hypertrophy or exudates, clear nasal discharge, bilateral TM normal EYES:   pupils equal and reactive, no scleral injection or discharge NECK:  normal ROM, no meningismus   RESP:  no increased work of breathing, clear to auscultation bilaterally CVS:   regular rate and rhythm Skin:   warm and dry    UC Treatments / Results  Labs (all labs ordered are listed, but only abnormal results are displayed) Labs Reviewed  RESP PANEL BY RT-PCR (FLU A&B, COVID) [id number]    EKG   Radiology No results found.  Procedures Procedures (including critical care time)  Medications Ordered in UC Medications - No data to display  Initial Impression / Assessment and Plan / UC Course  I have reviewed the triage vital signs and the nursing notes.  Pertinent labs & imaging results that were available during my care of the patient were reviewed by me and considered in my medical decision making (see chart for details).       Pt is a 28 y.o. female who presents for 2 days of respiratory symptoms. Deanna Harris is afebrile here without recent antipyretics. Satting well on room air. Overall pt is non-toxic appearing, well hydrated, without respiratory distress. Pulmonary exam is unremarkable.  COVID and influenza panel obtained and was negative. History consistent with viral respiratory illness. Zofran and albuterol prescribed. Discussed symptomatic treatment.  Explained lack of efficacy of antibiotics in viral disease.  Typical duration of symptoms discussed.   Return and ED precautions given and voiced understanding. Discussed MDM, treatment plan and plan for follow-up with patient who agrees with plan.     Final  Clinical Impressions(s) / UC Diagnoses   Final diagnoses:  Influenza-like illness     Discharge Instructions      Your COVID and influenza tests are negative. You have a viral respiratory infection that will gradually improve over the next 7-10 days. Cough may last up to 3 weeks. Pick up your albuterol inhaler.    You can take Tylenol and/or Ibuprofen as needed for fever reduction and pain relief.    For cough: honey 1/2 to 1 teaspoon (you can dilute the honey in water or another fluid).  You can also use guaifenesin and dextromethorphan for cough. You can use a humidifier for chest congestion and cough.  If you don't have a humidifier, you can sit in the bathroom with the hot shower running.      For sore throat: try warm salt water gargles, Mucinex sore throat cough drops or cepacol lozenges, throat spray, warm tea or water with lemon/honey, popsicles or ice, or OTC cold relief medicine for throat discomfort. You can also purchase chloraseptic spray at the pharmacy or dollar store.   For congestion: take a daily anti-histamine like Zyrtec, Claritin, and a oral decongestant, such as pseudoephedrine.  You can also use Flonase 1-2 sprays in each nostril daily. Afrin is also a good option, if you do not have high blood pressure.    It is important to stay hydrated: drink plenty of fluids (water, gatorade/powerade/pedialyte, juices, or teas) to keep your throat moisturized and help further relieve irritation/discomfort.    Return or go to the Emergency Department if symptoms worsen or do not improve in the next few days      ED Prescriptions     Medication Sig Dispense Auth. Provider   albuterol (VENTOLIN HFA) 108 (90 Base) MCG/ACT inhaler Inhale 2 puffs into the lungs every 4 (four) hours as needed. 6.7 g Amiree No, DO   ondansetron (ZOFRAN-ODT) 4 MG disintegrating tablet Take 1 tablet (4 mg total) by mouth every 8 (eight) hours as needed. 20 tablet Katha Cabal, DO       PDMP not reviewed this encounter.   Katha Cabal, DO 11/21/23 1014

## 2023-11-21 NOTE — Discharge Instructions (Addendum)
Your COVID and influenza tests are negative. You have a viral respiratory infection that will gradually improve over the next 7-10 days. Cough may last up to 3 weeks. Pick up your albuterol inhaler.    You can take Tylenol and/or Ibuprofen as needed for fever reduction and pain relief.    For cough: honey 1/2 to 1 teaspoon (you can dilute the honey in water or another fluid).  You can also use guaifenesin and dextromethorphan for cough. You can use a humidifier for chest congestion and cough.  If you don't have a humidifier, you can sit in the bathroom with the hot shower running.      For sore throat: try warm salt water gargles, Mucinex sore throat cough drops or cepacol lozenges, throat spray, warm tea or water with lemon/honey, popsicles or ice, or OTC cold relief medicine for throat discomfort. You can also purchase chloraseptic spray at the pharmacy or dollar store.   For congestion: take a daily anti-histamine like Zyrtec, Claritin, and a oral decongestant, such as pseudoephedrine.  You can also use Flonase 1-2 sprays in each nostril daily. Afrin is also a good option, if you do not have high blood pressure.    It is important to stay hydrated: drink plenty of fluids (water, gatorade/powerade/pedialyte, juices, or teas) to keep your throat moisturized and help further relieve irritation/discomfort.    Return or go to the Emergency Department if symptoms worsen or do not improve in the next few days

## 2023-11-21 NOTE — ED Triage Notes (Signed)
Sx x 2 days Flu exposure through work  Nasal congestion Fever Bodyaches Nausea Bilateral ear pain and itching

## 2023-12-05 ENCOUNTER — Other Ambulatory Visit: Payer: Self-pay

## 2023-12-11 ENCOUNTER — Other Ambulatory Visit: Payer: Medicaid Other

## 2023-12-13 ENCOUNTER — Other Ambulatory Visit: Payer: Medicaid Other

## 2023-12-16 ENCOUNTER — Ambulatory Visit: Payer: Medicaid Other | Admitting: "Endocrinology

## 2024-01-13 DIAGNOSIS — L309 Dermatitis, unspecified: Secondary | ICD-10-CM | POA: Insufficient documentation

## 2024-01-13 DIAGNOSIS — J302 Other seasonal allergic rhinitis: Secondary | ICD-10-CM | POA: Insufficient documentation

## 2024-04-15 ENCOUNTER — Encounter: Payer: Self-pay | Admitting: "Endocrinology

## 2024-04-15 ENCOUNTER — Ambulatory Visit (INDEPENDENT_AMBULATORY_CARE_PROVIDER_SITE_OTHER): Admitting: "Endocrinology

## 2024-04-15 VITALS — BP 102/80 | HR 65 | Ht 63.0 in | Wt 176.0 lb

## 2024-04-15 DIAGNOSIS — Z6831 Body mass index (BMI) 31.0-31.9, adult: Secondary | ICD-10-CM

## 2024-04-15 DIAGNOSIS — E6609 Other obesity due to excess calories: Secondary | ICD-10-CM | POA: Diagnosis not present

## 2024-04-15 DIAGNOSIS — E66811 Obesity, class 1: Secondary | ICD-10-CM

## 2024-04-15 DIAGNOSIS — R7989 Other specified abnormal findings of blood chemistry: Secondary | ICD-10-CM | POA: Diagnosis not present

## 2024-04-15 NOTE — Progress Notes (Signed)
 Outpatient Endocrinology Note Obadiah Birmingham, MD  04/15/24   Deanna Harris 1995/11/16 [id number]  Referring Provider: Lynda Bradley, CNM Primary Care Provider: Patient, No Pcp Per Subjective  No chief complaint on file.   Assessment & Plan  Diagnoses and all orders for this visit:  Abnormal thyroid  blood test -     TSH + free T4 -     US  THYROID ; Future  Class 1 obesity due to excess calories without serious comorbidity with body mass index (BMI) of 31.0 to 31.9 in adult   Deanna Harris is currently taking no thyroid  medication. Patient was biochemically euthyroid on last check, except mildly low T3 uptake ratio with unclear significance on 06/20/23.  Educated on thyroid  axis.  Recommend the following: repeat labs today. Repeat lab before next visit or sooner if symptoms of hyperthyroidism or hypothyroidism develop.  Notify us  immediately in case of pregnancy/breastfeeding or significant weight gain or loss. Counseled on compliance and follow up needs.  C/o some dysphagia  Grandma, mom and sister has thyroid  issues  Ordered baseline thyroid  ultrasound   Pt interested in weight loss Discussed lifestyle changes Maintain healthy lifestyle including 1200 Cal/day, 30 min of activity/day, avoiding refined/processed/outside food 20 minutes physical activity per day, in continuum or interruptedly through the day  Goals: less than 60 grams of carbohydrate/meal, 1200-1500 Cal/day, 10,0000 steps a day and weight loss of 0.5-1 lb/ wk  Sleep 7-9 hours/day, adapt good sleep hygiene Adapt de-stressing and relaxation techniques to prevent stress induced weight gain Avoid/switch medications that lead to weight gain by discussing with the prescribing physician   I have reviewed current medications, nurse's notes, allergies, vital signs, past medical and surgical history, family medical history, and social history for this encounter. Counseled patient on symptoms,  examination findings, lab findings, imaging results, treatment decisions and monitoring and prognosis. The patient understood the recommendations and agrees with the treatment plan. All questions regarding treatment plan were fully answered.   Return in about 4 weeks (around 05/13/2024) for visit, labs today.   Obadiah Birmingham, MD  04/15/24   I have reviewed current medications, nurse's notes, allergies, vital signs, past medical and surgical history, family medical history, and social history for this encounter. Counseled patient on symptoms, examination findings, lab findings, imaging results, treatment decisions and monitoring and prognosis. The patient understood the recommendations and agrees with the treatment plan. All questions regarding treatment plan were fully answered.   History of Present Illness Deanna Harris is a 28 y.o. year old female who presents to our clinic with ow T3 uptake ratio diagnosed in 06/2023.    Never been on thyroid  medication Grandma, mom and sister has thyroid  issues   Symptoms suggestive of HYPOTHYROIDISM:  fatigue Yes, sometimes  weight gain Yes cold intolerance  Yes constipation  No  Symptoms suggestive of HYPERTHYROIDISM:  weight loss  No heat intolerance No hyperdefecation  No palpitations  Yes, sometimes   Compressive symptoms:  dysphagia  Yes, sometimes  dysphonia  No positional dyspnea (especially with simultaneous arms elevation)  No  Smokes  No On biotin  No Personal history of head/neck surgery/irradiation  No  Physical Exam  BP 102/80   Pulse 65   Ht 5' 3 (1.6 m)   Wt 176 lb (79.8 kg)   SpO2 98%   BMI 31.18 kg/m  Constitutional: well developed, well nourished Head: normocephalic, atraumatic, no exophthalmos Eyes: sclera anicteric, no redness Neck: no thyromegaly, no thyroid  tenderness; no nodules palpated,  Pemberton's sign -ve Lungs: normal respiratory effort Neurology: alert and oriented, no fine hand tremor Skin:  dry, no appreciable rashes Musculoskeletal: no appreciable defects Psychiatric: normal mood and affect  Allergies Allergies  Allergen Reactions   Vancomycin Swelling   Tramadol Hives    Current Medications Patient's Medications  New Prescriptions   No medications on file  Previous Medications   ALBUTEROL  (VENTOLIN  HFA) 108 (90 BASE) MCG/ACT INHALER    Inhale 2 puffs into the lungs every 4 (four) hours as needed.   ASHLYNA  0.15-0.03 &0.01 MG TABLET    Take 1 tablet by mouth daily.   MELOXICAM (MOBIC) 15 MG TABLET    Take 15 mg by mouth daily.   ONDANSETRON  (ZOFRAN -ODT) 4 MG DISINTEGRATING TABLET    Take 1 tablet (4 mg total) by mouth every 8 (eight) hours as needed.   PROPRANOLOL (INDERAL) 10 MG TABLET    Take by mouth.  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 (HPV) 04/09/2017   Migraine headache with aura    Ovarian cyst 09/2013   left side   Postpartum care following vaginal delivery 02/18/2018   Supervision of other normal pregnancy, antepartum 07/25/2017   Clinic [facility] Prenatal Labs Dating 10 wk u/s Blood type: AB/Positive/-- (10/18 1519)  Genetic Screen 1 Screen: neg        Antibody:Negative (10/18 1519) Anatomic US  NORMAL FEMALE Rubella: 1.38 (10/18 1519)  Varicella: immune GTT 128 RPR: Non Reactive (10/18 1519)  Rhogam n/a HBsAg: Negative (10/18 1519)  TDaP vaccine 01/03/2018 HIV:   Neg Baby Food Bottle                       GBS:  Contraception    Past Surgical History Past Surgical History:  Procedure Laterality Date   CHOLECYSTECTOMY  04/24/2011   DILATION AND CURETTAGE OF UTERUS     for RPOC after elective AB    Family History family history includes Diabetes in her mother; Heart attack in her  maternal grandfather; Lung cancer in her maternal aunt and maternal grandmother; Pancreatic cancer in an other family member.  Social History Social History   Socioeconomic History   Marital status: Single    Spouse name: Not on file   Number of children: 2   Years of education: Not on file   Highest education level: Not on file  Occupational History   Occupation: Nurse, children's  Tobacco Use   Smoking status: Never   Smokeless tobacco: Never  Vaping Use   Vaping status: Never Used  Substance and Sexual Activity   Alcohol use: No   Drug use: No   Sexual activity: Yes    Partners: Male    Birth control/protection: Pill  Other Topics Concern   Not on file  Social History Narrative   Not on file   Social Drivers of Health   Financial Resource Strain: Not on file  Food Insecurity: No Food Insecurity (12/27/2023)   Received from [facility]   Hunger Vital Sign    Within the past 12 months, you worried that your food would run out before you got the money to buy more.: Never true    Within the past 12 months, the food  you bought just didn't last and you didn't have money to get more.: Never true  Transportation Needs: No Transportation Needs (12/27/2023)   Received from [facility] - Transportation    Lack of Transportation (Medical): No    Lack of Transportation (Non-Medical): No  Physical Activity: Not on file  Stress: Not on file  Social Connections: Not on file  Intimate Partner Violence: Not on file    Laboratory Investigations Lab Results  Component Value Date   TSH 2.520 06/20/2023     No results found for: TSI   No components found for: TRAB   No results found for: CHOL No results found for: HDL No results found for: LDLCALC No results found for: TRIG No results found for: [facility] Lab Results  Component Value Date   CREATININE 0.65 06/20/2023   No results found for: GFR    Component Value Date/Time   NA 140 06/20/2023  1146   NA 138 02/09/2014 2011   K 4.1 06/20/2023 1146   K 3.7 02/09/2014 2011   CL 104 06/20/2023 1146   CL 107 02/09/2014 2011   CO2 20 06/20/2023 1146   CO2 26 02/09/2014 2011   GLUCOSE 68 (L) 06/20/2023 1146   GLUCOSE 95 02/09/2014 2011   BUN 11 06/20/2023 1146   BUN 10 02/09/2014 2011   CREATININE 0.65 06/20/2023 1146   CREATININE 0.64 02/09/2014 2011   CALCIUM 9.2 06/20/2023 1146   CALCIUM 8.6 02/09/2014 2011   PROT 6.7 06/20/2023 1146   PROT 7.2 02/09/2014 2011   ALBUMIN 4.4 06/20/2023 1146   ALBUMIN 3.9 02/09/2014 2011   AST 22 06/20/2023 1146   AST 25 02/09/2014 2011   ALT 28 06/20/2023 1146   ALT 27 02/09/2014 2011   ALKPHOS 44 06/20/2023 1146   ALKPHOS 47 02/09/2014 2011   BILITOT 0.5 06/20/2023 1146   BILITOT 0.9 02/09/2014 2011   GFRNONAA >60 02/09/2014 2011   GFRAA >60 02/09/2014 2011      Latest Ref Rng & Units 06/20/2023   11:46 AM 02/09/2014    8:11 PM 09/22/2013    1:13 AM  BMP  Glucose 70 - 99 mg/dL 68  95  887   BUN 6 - 20 mg/dL 11  10  12    Creatinine 0.57 - 1.00 mg/dL 9.34  9.35  9.35   BUN/Creat Ratio 9 - 23 17     Sodium 134 - 144 mmol/L 140  138  139   Potassium 3.5 - 5.2 mmol/L 4.1  3.7  3.6   Chloride 96 - 106 mmol/L 104  107  107   CO2 20 - 29 mmol/L 20  26  28    Calcium 8.7 - 10.2 mg/dL 9.2  8.6  8.9        Component Value Date/Time   WBC 7.0 06/20/2023 1146   WBC 6.1 02/19/2018 1002   RBC 4.37 06/20/2023 1146   RBC 3.24 (L) 02/19/2018 1002   HGB 12.5 06/20/2023 1146   HCT 38.6 06/20/2023 1146   PLT 264 06/20/2023 1146   MCV 88 06/20/2023 1146   MCV 85 02/09/2014 2011   MCH 28.6 06/20/2023 1146   MCH 30.3 02/19/2018 1002   MCHC 32.4 06/20/2023 1146   MCHC 34.8 02/19/2018 1002   RDW 12.4 06/20/2023 1146   RDW 12.8 02/09/2014 2011   LYMPHSABS 1.8 06/20/2023 1146   LYMPHSABS 2.2 09/22/2013 0113   MONOABS 0.4 09/22/2013 0113   EOSABS 0.1 06/20/2023 1146  EOSABS 0.1 09/22/2013 0113   BASOSABS 0.0 06/20/2023 1146   BASOSABS 0.0  09/22/2013 0113      Parts of this note may have been dictated using voice recognition software. There may be variances in spelling and vocabulary which are unintentional. Not all errors are proofread. Please notify the dino if any discrepancies are noted or if the meaning of any statement is not clear.

## 2024-04-15 NOTE — Patient Instructions (Signed)
 Pt interested in weight loss Discussed lifestyle changes, medical management as well as bariatric surgery Maintain healthy lifestyle including 1200 Cal/day, 30 min of activity/day, avoiding refined/processed/outside food 20 minutes physical activity per day, in continuum or interruptedly through the day  Goals: less than 60 grams of carbohydrate/meal, 1200-1500 Cal/day, 10,0000 steps a day and weight loss of 0.5-1 lb/ wk  Sleep 7-9 hours/day, adapt good sleep hygiene Adapt de-stressing and relaxation techniques to prevent stress induced weight gain Avoid/switch medications that lead to weight gain by discussing with the prescribing physician

## 2024-04-16 LAB — TSH+FREE T4: TSH W/REFLEX TO FT4: 1.96 m[IU]/L

## 2024-04-21 ENCOUNTER — Other Ambulatory Visit

## 2024-05-05 DIAGNOSIS — E6609 Other obesity due to excess calories: Secondary | ICD-10-CM | POA: Insufficient documentation

## 2024-05-05 DIAGNOSIS — F419 Anxiety disorder, unspecified: Secondary | ICD-10-CM | POA: Insufficient documentation

## 2024-05-06 DIAGNOSIS — R7303 Prediabetes: Secondary | ICD-10-CM | POA: Insufficient documentation

## 2024-05-06 DIAGNOSIS — R7401 Elevation of levels of liver transaminase levels: Secondary | ICD-10-CM | POA: Insufficient documentation

## 2024-05-18 ENCOUNTER — Ambulatory Visit: Admitting: "Endocrinology

## 2024-05-26 DIAGNOSIS — E559 Vitamin D deficiency, unspecified: Secondary | ICD-10-CM | POA: Insufficient documentation

## 2024-06-01 DIAGNOSIS — K760 Fatty (change of) liver, not elsewhere classified: Secondary | ICD-10-CM | POA: Insufficient documentation

## 2024-06-11 ENCOUNTER — Ambulatory Visit
Admission: EM | Admit: 2024-06-11 | Discharge: 2024-06-11 | Disposition: A | Discharge status: Home / Self Care | Attending: Family Medicine | Admitting: Family Medicine

## 2024-06-11 DIAGNOSIS — J4520 Mild intermittent asthma, uncomplicated: Secondary | ICD-10-CM | POA: Insufficient documentation

## 2024-06-11 DIAGNOSIS — H6993 Unspecified Eustachian tube disorder, bilateral: Secondary | ICD-10-CM | POA: Insufficient documentation

## 2024-06-11 DIAGNOSIS — G43009 Migraine without aura, not intractable, without status migrainosus: Secondary | ICD-10-CM | POA: Diagnosis not present

## 2024-06-11 DIAGNOSIS — U071 COVID-19: Secondary | ICD-10-CM | POA: Diagnosis not present

## 2024-06-11 LAB — RESP PANEL BY RT-PCR (FLU A&B, COVID) ARPGX2
Influenza A by PCR: NEGATIVE
Influenza B by PCR: NEGATIVE
SARS Coronavirus 2 by RT PCR: POSITIVE — AB

## 2024-06-11 LAB — GROUP A STREP BY PCR: Group A Strep by PCR: NOT DETECTED

## 2024-06-11 MED ORDER — MOLNUPIRAVIR 200 MG PO CAPS: 4.0000 | ORAL_CAPSULE | Freq: Two times a day (BID) | ORAL | 0 refills | Status: AC

## 2024-06-11 MED ORDER — PREDNISONE 20 MG PO TABS: 40.0000 mg | ORAL_TABLET | Freq: Every day | ORAL | 0 refills | Status: AC

## 2024-06-11 MED ORDER — BENZONATATE 200 MG PO CAPS: 200.0000 mg | ORAL_CAPSULE | Freq: Three times a day (TID) | ORAL | 0 refills | Status: DC | PRN

## 2024-06-11 NOTE — ED Triage Notes (Signed)
 Cough, sore throat, congestion, headache, fever 101 x 1 day. Taking sudafed.

## 2024-06-11 NOTE — Discharge Instructions (Addendum)
 You have tested positive for COVID-19.  Start molnupiravir  twice daily for 5 days.  You may take Tessalon  3 times a day as needed for your cough.  Continue albuterol  inhaler as needed.  Start prednisone  daily for 5 days.  Lots of rest and fluids.  Please follow-up with your PCP in 2 to 3 days for recheck.  Please go to the ER for any worsening symptoms.  Hope you feel better soon!

## 2024-06-11 NOTE — ED Provider Notes (Signed)
 [facility]    CSN: [id number] Arrival date & time: 06/11/24  0808      History   Chief Complaint Chief Complaint  Patient presents with   Cough   Sore Throat    HPI Deanna Harris is a 28 y.o. female  presents for evaluation of URI symptoms for 1 days. Patient reports associated symptoms of sore throat cough, congestion, fever, headache, ear pain. Denies N/V/D, body aches, shortness of breath. Patient does have a hx of asthma.  Has not needed to use albuterol  inhaler since symptom onset.  Patient is not an active smoker.   Reports no known sick contacts.  Pt has taken Sudafed OTC for symptoms. Pt has no other concerns at this time.    Cough Associated symptoms: ear pain and sore throat   Sore Throat    Past Medical History:  Diagnosis Date   Asthma    Atypical squamous cell changes of undetermined significance (ASCUS) on cervical cytology with positive high risk human papilloma virus (HPV) 04/09/2017   Migraine headache with aura    Ovarian cyst 09/2013   left side   Postpartum care following vaginal delivery 02/18/2018   Supervision of other normal pregnancy, antepartum 07/25/2017   [facility]inic [facility] Prenatal Labs Dating 10 wk u/s Blood type: AB/Positive/-- (10/18 1519)  Genetic Screen 1 Screen: neg        Antibody:Negative (10/18 1519) Anatomic US  NORMAL FEMALE Rubella: 1.38 (10/18 1519)  Varicella: immune GTT 128 RPR: Non Reactive (10/18 1519)  Rhogam n/a HBsAg: Negative (10/18 1519)  TDaP vaccine 01/03/2018 HIV:   Neg Baby Food Bottle                       GBS:  Contraception    Patient Active Problem List   Diagnosis Date Noted   Carpal tunnel syndrome on right 05/29/2023   Cubital tunnel syndrome on right 05/29/2023   Carpal tunnel syndrome of left wrist 08/23/2021   Cubital tunnel syndrome on left 08/23/2021   Migraine without aura and responsive to treatment 03/17/2018   Chronic pelvic pain in female 04/09/2017    Past Surgical History:  Procedure  Laterality Date   CHOLECYSTECTOMY  04/24/2011   DILATION AND CURETTAGE OF UTERUS     for RPOC after elective AB    OB History     Gravida  4   Para  3   Term  3   Preterm      AB  1   Living  3      SAB      IAB  1   Ectopic      Multiple  0   Live Births  3            Home Medications    Prior to Admission medications   Medication Sig Start Date End Date Taking? Authorizing Provider  ASHLYNA  0.15-0.03 &0.01 MG tablet Take 1 tablet by mouth daily. 09/13/23  Yes Lynda Bradley, CNM  benzonatate  (TESSALON ) 200 MG capsule Take 1 capsule (200 mg total) by mouth 3 (three) times daily as needed. 06/11/24  Yes Maimuna Leaman, Jodi R, NP  escitalopram (LEXAPRO) 5 MG tablet Take 5 mg by mouth. 05/26/24 08/24/24 Yes [provider]  molnupiravir  EUA (LAGEVRIO ) 200 MG CAPS capsule Take 4 capsules (800 mg total) by mouth 2 (two) times daily for 5 days. 06/11/24 06/16/24 Yes Ailish Prospero, Jodi R, NP  ondansetron  (ZOFRAN -ODT) 4 MG disintegrating tablet Take 1 tablet (  4 mg total) by mouth every 8 (eight) hours as needed. 11/21/23  Yes Brimage, Vondra, DO  predniSONE  (DELTASONE ) 20 MG tablet Take 2 tablets (40 mg total) by mouth daily with breakfast for 5 days. 06/11/24 06/16/24 Yes Kodie Kishi, Jodi R, NP  propranolol (INDERAL) 10 MG tablet Take by mouth. 10/07/23 10/06/24 Yes [provider]  albuterol  (VENTOLIN  HFA) 108 (90 Base) MCG/ACT inhaler Inhale 2 puffs into the lungs every 4 (four) hours as needed. 11/21/23   Brimage, Vondra, DO  meloxicam (MOBIC) 15 MG tablet Take 15 mg by mouth daily.    [provider]    Family History Family History  Problem Relation Age of Onset   Diabetes Mother        ?CHTN   Lung cancer Maternal Aunt    Lung cancer Maternal Grandmother    Heart attack Maternal Grandfather    Pancreatic cancer Other     Social History Social History   Tobacco Use   Smoking status: Never   Smokeless tobacco: Never  Vaping Use   Vaping status: Never Used   Substance Use Topics   Alcohol use: No   Drug use: No     Allergies   Cat dander, Vancomycin, and Tramadol   Review of Systems Review of Systems  HENT:  Positive for congestion, ear pain and sore throat.   Respiratory:  Positive for cough.      Physical Exam Triage Vital Signs ED Triage Vitals  Encounter Vitals Group     BP 06/11/24 0843 113/75     Girls Systolic BP Percentile --      Girls Diastolic BP Percentile --      Boys Systolic BP Percentile --      Boys Diastolic BP Percentile --      Pulse Rate 06/11/24 0843 78     Resp 06/11/24 0843 16     Temp 06/11/24 0843 98.8 F (37.1 C)     Temp Source 06/11/24 0843 Oral     SpO2 06/11/24 0843 99 %     Weight --      Height --      Head Circumference --      Peak Flow --      Pain Score 06/11/24 0845 7     Pain Loc --      Pain Education --      Exclude from Growth Chart --    No data found.  Updated Vital Signs BP 113/75 (BP Location: Right Arm)   Pulse 78   Temp 98.8 F (37.1 C) (Oral)   Resp 16   LMP 03/02/2024 (Approximate)   SpO2 99%   Visual Acuity Right Eye Distance:   Left Eye Distance:   Bilateral Distance:    Right Eye Near:   Left Eye Near:    Bilateral Near:     Physical Exam Vitals and nursing note reviewed.  Constitutional:      General: She is not in acute distress.    Appearance: She is well-developed. She is not ill-appearing.  HENT:     Head: Normocephalic and atraumatic.     Right Ear: Ear canal normal. A middle ear effusion is present.     Left Ear: Ear canal normal. A middle ear effusion is present.     Nose: Congestion present.     Mouth/Throat:     Mouth: Mucous membranes are moist.     Pharynx: Oropharynx is clear. Uvula midline. Posterior oropharyngeal erythema present.  Tonsils: No tonsillar exudate or tonsillar abscesses.  Eyes:     Conjunctiva/sclera: Conjunctivae normal.     Pupils: Pupils are equal, round, and reactive to light.  Cardiovascular:     Rate  and Rhythm: Normal rate and regular rhythm.     Heart sounds: Normal heart sounds.  Pulmonary:     Effort: Pulmonary effort is normal.     Breath sounds: Normal breath sounds.  Musculoskeletal:     Cervical back: Normal range of motion and neck supple.  Lymphadenopathy:     Cervical: No cervical adenopathy.  Skin:    General: Skin is warm and dry.  Neurological:     General: No focal deficit present.     Mental Status: She is alert and oriented to person, place, and time.  Psychiatric:        Mood and Affect: Mood normal.        Behavior: Behavior normal.      UC Treatments / Results  Labs (all labs ordered are listed, but only abnormal results are displayed) Labs Reviewed  RESP PANEL BY RT-PCR (FLU A&B, COVID) ARPGX2 - Abnormal; Notable for the following components:      Result Value   SARS Coronavirus 2 by RT PCR POSITIVE (*)    All other components within normal limits  GROUP A STREP BY PCR    suggestion  Information displayed in this report may not trend or trigger automated decision support.     Contains abnormal data Comprehensive Metabolic Panel Order: [id number] Component Ref Range & Units 3 wk ago  Sodium 135 - 145 mmol/L 140  Potassium 3.4 - 4.8 mmol/L 4.3  Chloride 98 - 107 mmol/L 106  CO2 20.0 - 31.0 mmol/L 26.0  Anion Gap 5 - 14 mmol/L 8  BUN 9 - 23 mg/dL 8 Low   Creatinine 9.44 - 1.02 mg/dL 9.20  BUN/Creatinine Ratio 10  eGFR CKD-EPI (2021) Female >=60 mL/min/1.61m2 >90  Comment: eGFR calculated with CKD-EPI 2021 equation in accordance with [employer] and [employer] of Nephrology Task Force recommendations.  Glucose 70 - 179 mg/dL 85  Calcium 8.7 - 89.5 mg/dL 9.6  Albumin 3.4 - 5.0 g/dL 4.0  Total Protein 5.7 - 8.2 g/dL 7.3  Total Bilirubin 0.3 - 1.2 mg/dL 0.9  AST <=65 U/L 96 High   ALT 10 - 49 U/L 133 High   Alkaline Phosphatase 46 - 116 U/L 44 Low   Resulting Agency [facility]    Specimen Collected: 05/15/24 09:58   Performed by: [facility] Last Resulted: 05/15/24 16:45  Received From: [facility]  Result Received: 06/11/24 08:08   EKG   Radiology No results found.  Procedures Procedures (including critical care time)  Medications Ordered in UC Medications - No data to display  Initial Impression / Assessment and Plan / UC Course  I have reviewed the triage vital signs and the nursing notes.  Pertinent labs & imaging results that were available during my care of the patient were reviewed by me and considered in my medical decision making (see chart for details).     Reviewed exam and symptoms with patient.  No red flags.  Positive COVID PCR, negative strep and flu PCR.  Due to patient asthma does qualify for antiviral therapy.  Due to liver function will do molnupiravir  twice daily for 5 days.  Tessalon  as needed for cough.  Prednisone  daily for 5 days.  Discussed continuation of albuterol  inhaler if  needed.  Reviewed CDC guidelines for quarantine.  Encouraged rest fluids and PCP follow-up 2 to 3 days for recheck.  Strict ER precautions reviewed and patient verbalized understanding. Final Clinical Impressions(s) / UC Diagnoses   Final diagnoses:  COVID-19  Mild intermittent asthma, unspecified whether complicated  Eustachian tube dysfunction, bilateral  Migraine without aura and responsive to treatment     Discharge Instructions      You have tested positive for COVID-19.  Start molnupiravir  twice daily for 5 days.  You may take Tessalon  3 times a day as needed for your cough.  Continue albuterol  inhaler as needed.  Start prednisone  daily for 5 days.  Lots of rest and fluids.  Please follow-up with your PCP in 2 to 3 days for recheck.  Please go to the ER for any worsening symptoms.  Hope you feel better soon!     ED Prescriptions     Medication Sig Dispense Auth. Provider   predniSONE  (DELTASONE ) 20 MG tablet Take  2 tablets (40 mg total) by mouth daily with breakfast for 5 days. 10 tablet Mirko Tailor, Jodi R, NP   benzonatate  (TESSALON ) 200 MG capsule Take 1 capsule (200 mg total) by mouth 3 (three) times daily as needed. 20 capsule Mirai Greenwood, Jodi R, NP   molnupiravir  EUA (LAGEVRIO ) 200 MG CAPS capsule Take 4 capsules (800 mg total) by mouth 2 (two) times daily for 5 days. 40 capsule Dajane Valli, Jodi R, NP      PDMP not reviewed this encounter.   Loreda Myla SAUNDERS, NP 06/11/24 [id number]

## 2024-09-25 ENCOUNTER — Encounter: Payer: Self-pay | Admitting: Emergency Medicine

## 2024-09-25 ENCOUNTER — Ambulatory Visit
Admission: EM | Admit: 2024-09-25 | Discharge: 2024-09-25 | Disposition: A | Discharge status: Home / Self Care | Attending: Emergency Medicine | Admitting: Emergency Medicine

## 2024-09-25 DIAGNOSIS — J029 Acute pharyngitis, unspecified: Secondary | ICD-10-CM

## 2024-09-25 DIAGNOSIS — J069 Acute upper respiratory infection, unspecified: Secondary | ICD-10-CM | POA: Diagnosis not present

## 2024-09-25 LAB — POC COVID19/FLU A&B COMBO
Covid Antigen, POC: NEGATIVE
Influenza A Antigen, POC: NEGATIVE
Influenza B Antigen, POC: NEGATIVE

## 2024-09-25 LAB — POCT RAPID STREP A (OFFICE): Rapid Strep A Screen: NEGATIVE

## 2024-09-25 NOTE — Discharge Instructions (Signed)
Most likely you have a viral illness: no antibiotic as indicated at this time, May treat with OTC meds of choice. Make sure to drink plenty of fluids to stay hydrated(gatorade, water, popsicles,jello,etc), avoid caffeine products. Follow up with PCP. Return as needed. 

## 2024-09-25 NOTE — ED Triage Notes (Signed)
 Patient c/o cough, fever, sore throat, and headaches that started last night.  Patient states hat she was exposed to Flu B.

## 2024-09-25 NOTE — ED Provider Notes (Incomplete)
 " [facility]    CSN: [id number] Arrival date & time: 09/25/24  1045      History   Chief Complaint Chief Complaint  Patient presents with   Sore Throat   Cough    HPI Deanna Harris is a 28 y.o. female.    Sore Throat  Cough   Past Medical History:  Diagnosis Date   Asthma    Atypical squamous cell changes of undetermined significance (ASCUS) on cervical cytology with positive high risk human papilloma virus (HPV) 04/09/2017   Migraine headache with aura    Ovarian cyst 09/2013   left side   Postpartum care following vaginal delivery 02/18/2018   Supervision of other normal pregnancy, antepartum 07/25/2017   [facility] Prenatal Labs Dating 10 wk u/s Blood type: AB/Positive/-- (10/18 1519)  Genetic Screen 1 Screen: neg        Antibody:Negative (10/18 1519) Anatomic US  NORMAL FEMALE Rubella: 1.38 (10/18 1519)  Varicella: immune GTT 128 RPR: Non Reactive (10/18 1519)  Rhogam n/a HBsAg: Negative (10/18 1519)  TDaP vaccine 01/03/2018 HIV:   Neg Baby Food Bottle                       GBS:  Contraception    Patient Active Problem List   Diagnosis Date Noted   Carpal tunnel syndrome on right 05/29/2023   Cubital tunnel syndrome on right 05/29/2023   Carpal tunnel syndrome of left wrist 08/23/2021   Cubital tunnel syndrome on left 08/23/2021   Migraine without aura and responsive to treatment 03/17/2018   Chronic pelvic pain in female 04/09/2017    Past Surgical History:  Procedure Laterality Date   CHOLECYSTECTOMY  04/24/2011   DILATION AND CURETTAGE OF UTERUS     for RPOC after elective AB    OB History     Gravida  4   Para  3   Term  3   Preterm      AB  1   Living  3      SAB      IAB  1   Ectopic      Multiple  0   Live Births  3            Home Medications    Prior to Admission medications  Medication Sig Start Date End Date Taking? Authorizing Provider  albuterol  (VENTOLIN  HFA) 108 (90 Base) MCG/ACT inhaler  Inhale 2 puffs into the lungs every 4 (four) hours as needed. 11/21/23   Brimage, Vondra, DO  ASHLYNA  0.15-0.03 &0.01 MG tablet Take 1 tablet by mouth daily. 09/13/23   Lynda Bradley, CNM  benzonatate  (TESSALON ) 200 MG capsule Take 1 capsule (200 mg total) by mouth 3 (three) times daily as needed. 06/11/24   Mayer, Jodi R, NP  escitalopram (LEXAPRO) 5 MG tablet Take 5 mg by mouth. 05/26/24 08/24/24  [provider]  meloxicam (MOBIC) 15 MG tablet Take 15 mg by mouth daily.    [provider]  ondansetron  (ZOFRAN -ODT) 4 MG disintegrating tablet Take 1 tablet (4 mg total) by mouth every 8 (eight) hours as needed. 11/21/23   Brimage, Vondra, DO  propranolol (INDERAL) 10 MG tablet Take by mouth. 10/07/23 10/06/24  [provider]    Family History Family History  Problem Relation Age of Onset   Diabetes Mother        ?CHTN   Lung cancer Maternal Aunt    Lung cancer Maternal Grandmother  Heart attack Maternal Grandfather    Pancreatic cancer Other     Social History Social History[1]   Allergies   Cat dander, Vancomycin, and Tramadol   Review of Systems Review of Systems  Respiratory:  Positive for cough.      Physical Exam Triage Vital Signs ED Triage Vitals [09/25/24 1212]  Encounter Vitals Group     BP      Girls Systolic BP Percentile      Girls Diastolic BP Percentile      Boys Systolic BP Percentile      Boys Diastolic BP Percentile      Pulse      Resp      Temp      Temp src      SpO2      Weight      Height      Head Circumference      Peak Flow      Pain Score 9     Pain Loc      Pain Education      Exclude from Growth Chart    No data found.  Updated Vital Signs LMP 09/15/2024 (Approximate)   Visual Acuity Right Eye Distance:   Left Eye Distance:   Bilateral Distance:    Right Eye Near:   Left Eye Near:    Bilateral Near:     Physical Exam   UC Treatments / Results  Labs (all labs ordered are listed, but only  abnormal results are displayed) Labs Reviewed  POC COVID19/FLU A&B COMBO  POCT RAPID STREP A (OFFICE)    EKG   Radiology No results found.  Procedures Procedures (including critical care time)  Medications Ordered in UC Medications - No data to display  Initial Impression / Assessment and Plan / UC Course  I have reviewed the triage vital signs and the nursing notes.  Pertinent labs & imaging results that were available during my care of the patient were reviewed by me and considered in my medical decision making (see chart for details).     *** Final Clinical Impressions(s) / UC Diagnoses   Final diagnoses:  None   Discharge Instructions   None    ED Prescriptions   None    PDMP not reviewed this encounter.    [1]  Social History Tobacco Use   Smoking status: Never   Smokeless tobacco: Never  Vaping Use   Vaping status: Never Used  Substance Use Topics   Alcohol use: No   Drug use: No   "

## 2024-09-27 LAB — CULTURE, GROUP A STREP (THRC)

## 2024-09-28 ENCOUNTER — Ambulatory Visit (HOSPITAL_COMMUNITY): Payer: Self-pay

## 2024-09-28 MED ORDER — AMOXICILLIN 500 MG PO CAPS: 500.0000 mg | ORAL_CAPSULE | Freq: Two times a day (BID) | ORAL | 0 refills | Status: AC

## 2024-10-20 ENCOUNTER — Other Ambulatory Visit (HOSPITAL_COMMUNITY)
Admission: RE | Admit: 2024-10-20 | Discharge: 2024-10-20 | Disposition: A | Source: Ambulatory Visit | Attending: Advanced Practice Midwife | Admitting: Advanced Practice Midwife

## 2024-10-20 ENCOUNTER — Encounter: Payer: Self-pay | Admitting: Advanced Practice Midwife

## 2024-10-20 ENCOUNTER — Ambulatory Visit: Admitting: Advanced Practice Midwife

## 2024-10-20 VITALS — BP 114/76 | HR 80 | Ht 63.0 in | Wt 176.6 lb

## 2024-10-20 DIAGNOSIS — Z3202 Encounter for pregnancy test, result negative: Secondary | ICD-10-CM | POA: Diagnosis not present

## 2024-10-20 DIAGNOSIS — Z124 Encounter for screening for malignant neoplasm of cervix: Secondary | ICD-10-CM | POA: Insufficient documentation

## 2024-10-20 DIAGNOSIS — Z23 Encounter for immunization: Secondary | ICD-10-CM | POA: Diagnosis not present

## 2024-10-20 DIAGNOSIS — Z1331 Encounter for screening for depression: Secondary | ICD-10-CM

## 2024-10-20 DIAGNOSIS — Z01419 Encounter for gynecological examination (general) (routine) without abnormal findings: Secondary | ICD-10-CM | POA: Diagnosis not present

## 2024-10-20 DIAGNOSIS — Z30016 Encounter for initial prescription of transdermal patch hormonal contraceptive device: Secondary | ICD-10-CM

## 2024-10-20 DIAGNOSIS — Z110 Encounter for screening for intestinal infectious diseases: Secondary | ICD-10-CM | POA: Insufficient documentation

## 2024-10-20 DIAGNOSIS — Z113 Encounter for screening for infections with a predominantly sexual mode of transmission: Secondary | ICD-10-CM

## 2024-10-20 MED ORDER — NORELGESTROMIN-ETH ESTRADIOL 150-35 MCG/24HR TD PTWK: 1.0000 | MEDICATED_PATCH | TRANSDERMAL | 12 refills | Status: AC

## 2024-10-20 NOTE — Patient Instructions (Addendum)
 Human Papillomavirus (HPV) Vaccine Injection What is this medication? HUMAN PAPILLOMAVIRUS VACCINE (HYOO muhn pap uh LOH muh vahy ruhs vak SEEN) reduces the risk of human papillomavirus (HPV). It does not treat HPV. It is still possible to get HPV after receiving this vaccine, but the symptoms may be less severe or not last as long. It works by helping your immune system learn how to fight off a future infection. This medicine may be used for other purposes; ask your health care provider or pharmacist if you have questions. COMMON BRAND NAME(S): Gardasil 9 What should I tell my care team before I take this medication? They need to know if you have any of these conditions: Fever Hemophilia HIV or AIDS Immune system problems Infection Low platelets An unusual reaction to human papillomavirus vaccine, yeast, other vaccines, other medications, foods, dyes, or preservatives Pregnant or trying to get pregnant Breastfeeding How should I use this medication? This vaccine is injected into a muscle. It is given by your care team. This vaccine requires 2 or 3 doses to get the full benefit. Set a reminder for when your next dose is due. A copy of the Vaccine Information Statement will be given before each vaccination. Be sure to read this information carefully each time. This sheet may change often. Talk to your care team about the use of this medication in children. While it may be prescribed for children as young as 9 years for selected conditions, precautions do apply. Overdosage: If you think you have taken too much of this medicine contact a poison control center or emergency room at once. NOTE: This medicine is only for you. Do not share this medicine with others. What if I miss a dose? Keep appointments for follow-up doses as directed. It is important not to miss your dose. Call your care team if you are unable to keep an appointment. What may interact with this medication? Certain medications  for arthritis Medications for organ transplant Medications to treat cancer Steroid medications, such as prednisone  or cortisone This list may not describe all possible interactions. Give your health care provider a list of all the medicines, herbs, non-prescription drugs, or dietary supplements you use. Also tell them if you smoke, drink alcohol, or use illegal drugs. Some items may interact with your medicine. What should I watch for while using this medication? Visit your care team regularly. Report any side effects to your care team right away. This vaccine, like all vaccines, may not fully protect everyone. What side effects may I notice from receiving this medication? Side effects that you should report to your care team as soon as possible: Allergic reactions--skin rash, itching, hives, swelling of the face, lips, tongue, or throat Feeling faint or lightheaded Side effects that usually do not require medical attention (report these to your care team if they continue or are bothersome): Diarrhea Dizziness Fatigue Fever Headache Nausea Pain, redness, irritation, or bruising at the injection site This list may not describe all possible side effects. Call your doctor for medical advice about side effects. You may report side effects to FDA at 1-800-FDA-1088. Where should I keep my medication? This vaccine is only given by your care team. It will not be stored at home. NOTE: This sheet is a summary. It may not cover all possible information. If you have questions about this medicine, talk to your doctor, pharmacist, or health care provider.  2024 Elsevier/Gold Standard (2022-03-07 00:00:00)Preventive Care 77-31 Years Old, Female Preventive care refers to lifestyle choices  and visits with your health care provider that can promote health and wellness. Preventive care visits are also called wellness exams. What can I expect for my preventive care visit? Counseling During your preventive  care visit, your health care provider may ask about your: Medical history, including: Past medical problems. Family medical history. Pregnancy history. Current health, including: Menstrual cycle. Method of birth control. Emotional well-being. Home life and relationship well-being. Sexual activity and sexual health. Lifestyle, including: Alcohol, nicotine or tobacco, and drug use. Access to firearms. Diet, exercise, and sleep habits. Work and work astronomer. Sunscreen use. Safety issues such as seatbelt and bike helmet use. Physical exam Your health care provider may check your: Height and weight. These may be used to calculate your BMI (body mass index). BMI is a measurement that tells if you are at a healthy weight. Waist circumference. This measures the distance around your waistline. This measurement also tells if you are at a healthy weight and may help predict your risk of certain diseases, such as type 2 diabetes and high blood pressure. Heart rate and blood pressure. Body temperature. Skin for abnormal spots. What immunizations do I need?  Vaccines are usually given at various ages, according to a schedule. Your health care provider will recommend vaccines for you based on your age, medical history, and lifestyle or other factors, such as travel or where you work. What tests do I need? Screening Your health care provider may recommend screening tests for certain conditions. This may include: Pelvic exam and Pap test. Lipid and cholesterol levels. Diabetes screening. This is done by checking your blood sugar (glucose) after you have not eaten for a while (fasting). Hepatitis B test. Hepatitis C test. HIV (human immunodeficiency virus) test. STI (sexually transmitted infection) testing, if you are at risk. BRCA-related cancer screening. This may be done if you have a family history of breast, ovarian, tubal, or peritoneal cancers. Talk with your health care provider  about your test results, treatment options, and if necessary, the need for more tests. Follow these instructions at home: Eating and drinking  Eat a healthy diet that includes fresh fruits and vegetables, whole grains, lean protein, and low-fat dairy products. Take vitamin and mineral supplements as recommended by your health care provider. Do not drink alcohol if: Your health care provider tells you not to drink. You are pregnant, may be pregnant, or are planning to become pregnant. If you drink alcohol: Limit how much you have to 0-1 drink a day. Know how much alcohol is in your drink. In the [place] one drink equals one 12 oz bottle of beer (355 mL), one 5 oz glass of wine (148 mL), or one 1 oz glass of hard liquor (44 mL). Lifestyle Brush your teeth every morning and night with fluoride toothpaste. Floss one time each day. Exercise for at least 30 minutes 5 or more days each week. Do not use any products that contain nicotine or tobacco. These products include cigarettes, chewing tobacco, and vaping devices, such as e-cigarettes. If you need help quitting, ask your health care provider. Do not use drugs. If you are sexually active, practice safe sex. Use a condom or other form of protection to prevent STIs. If you do not wish to become pregnant, use a form of birth control. If you plan to become pregnant, see your health care provider for a prepregnancy visit. Find healthy ways to manage stress, such as: Meditation, yoga, or listening to music. Journaling. Talking to a trusted person.  Spending time with friends and family. Minimize exposure to UV radiation to reduce your risk of skin cancer. Safety Always wear your seat belt while driving or riding in a vehicle. Do not drive: If you have been drinking alcohol. Do not ride with someone who has been drinking. If you have been using any mind-altering substances or drugs. While texting. When you are tired or distracted. Wear a helmet  and other protective equipment during sports activities. If you have firearms in your house, make sure you follow all gun safety procedures. Seek help if you have been physically or sexually abused. What's next? Go to your health care provider once a year for an annual wellness visit. Ask your health care provider how often you should have your eyes and teeth checked. Stay up to date on all vaccines. This information is not intended to replace advice given to you by your health care provider. Make sure you discuss any questions you have with your health care provider. Document Revised: 03/22/2021 Document Reviewed: 03/22/2021 Elsevier Patient Education  2024 Elsevier Inc. How to Do a Breast Self-Exam Doing breast self-exams can help you stay healthy. They're one way to know what's normal for your breasts. They can help you catch a problem while it's still small and can be treated. You need to: Check your breasts often. Tell your doctor about any changes. You should do breast self-exams even if you have breast implants. What you need: A mirror. A well-lit room. A pillow or other soft object. How to do a breast self-exam Look for changes  Take off all the clothes above your waist. Stand in front of a mirror in a room with good lighting. Put your hands down at your sides. Compare your breasts in the mirror. Look for difference between them, such as: Differences in shape. Differences in size. Wrinkles, dips, and bumps in one breast and not the other. Look at each breast for skin changes, such as: Redness. Scaly spots. Spots where your skin is thicker. Dimpling. Open sores. Look for changes in your nipples, such as: Fluid coming out of a nipple. Fluid around a nipple. Bleeding. Dimpling. Redness. A nipple that looks pushed in or that has changed position. Feel for changes Lie on your back. Feel each breast. To do this: Pick a breast to feel. Place a pillow under the  shoulder closest to that breast. Put the arm closest to that breast behind your head. Feel the breast using the hand of your other arm. Use the pads of your three middle fingers to make small circles starting near the nipple. Use light, medium, and firm pressure. Keep making circles, moving down over the breast. Stop when you feel your ribs. Start making circles with your fingers again, this time going up until you reach your collarbone. Then, make circles out across your breast and into your armpit area. Squeeze your nipple. Check for fluid and lumps. Do these steps again to check your other breast. Sit or stand in the tub or shower. With soapy water on your skin, feel each breast the same way you did when you were lying down. Write down what you find Writing down what you find can help you keep track of what you want to tell your doctor. Write down: What's normal for each breast. Any changes you find. Write down: The kind of change. If your breast feels tender or painful. Any lump you find. Write down its size and where it is. When you last had  your period. General tips If you're breastfeeding, the best time to check your breasts is after you feed your baby or after you use a breast pump. If you get a period, the best time to check your breasts is 5-7 days after your period ends. With time, you'll get more used to doing the self-exam. You'll also start to know if there are changes in your breasts. Contact a doctor if: You see a change in the shape or size of your breasts or nipples. You see a change in the skin of your breast or nipples. You have fluid coming from your nipples that isn't normal. You find a new lump or thick area. You have breast pain. You have any concerns about your breast health. This information is not intended to replace advice given to you by your health care provider. Make sure you discuss any questions you have with your health care provider. Document Revised:  12/04/2023 Document Reviewed: 12/04/2023 Elsevier Patient Education  2025 [employer]. Pap Test: What to Know Why am I having this test? A Pap test, also called a Pap smear, is a screening test to check for signs of: Infection. Cancer of the cervix. The cervix is the lowest part of the uterus. Precancerous changes. These are changes that may be a sign that cancer is developing. Females need this test regularly. In general, you should have a Pap test every 3 years until you reach menopause or you are 31 years old. If you are 43-68 years old you may choose to have their Pap test done at the same time as an human papillomavirus (HPV) test every 5 years instead of every 3 years. Your health care provider may recommend having Pap tests more or less often depending on your medical conditions and past Pap test results. What is being tested? Cervical cells are tested for signs of infection or abnormalities. What kind of sample is taken?  Your provider will collect a sample of cells from the surface of your cervix. This will be done using a small cotton swab, plastic spatula, or brush that is inserted into your vagina using a tool called a speculum. This sample is often collected during a pelvic exam, when you are lying on your back on an exam table with your feet in footrests, called stirrups. In some cases, fluids (secretions) from the cervix or vagina may also be collected. How do I prepare for this test? Know where you are in your menstrual cycle. If you're menstruating on the day of the test, you may be asked to reschedule. You may need to reschedule if you have a known vaginal infection on the day of the test. Follow instructions from your provider about: Changing or stopping your regular medicines. Some medicines, such as vaginal medicines and tetracycline, can cause abnormal test results. Avoiding douching 2-3 days before or the day of the test. Tell a health care provider about: Any  allergies you have. All medicines you take. These include vitamins, herbs, eye drops, and creams. Any bleeding problems you have. Any surgeries you've had. Any medical problems you have. Whether you're pregnant or may be pregnant. How are the results reported? Your test results will be reported as either abnormal or normal. What do the results mean? A normal test result means that you do not have signs of cancer of the cervix. An abnormal result may mean that you have: Cancer. A Pap test by itself is not enough to diagnose cancer. You will have more tests  done if cancer is suspected. Precancerous changes in your cervix. Inflammation of the cervix. A sexually transmitted infection (STI). A fungal infection. An infection from a parasite. Talk with your provider about what your results mean. More tests may be needed. Questions to ask your health care provider Ask your provider, or the department that is doing the test: When will my results be ready? How will I get my results? What are my treatment options? What other tests do I need? What are my next steps? This information is not intended to replace advice given to you by your health care provider. Make sure you discuss any questions you have with your health care provider. Document Revised: 12/14/2023 Document Reviewed: 12/14/2023 Elsevier Patient Education  2025 [employer].

## 2024-10-20 NOTE — Progress Notes (Signed)
 "     Gynecology Annual Exam   Date of Service: 10/20/2024  PCP: Patient, No Pcp Per  Chief Complaint:  Chief Complaint  Patient presents with   Annual Exam    History of Present Illness: Patient is a 29 y.o. H5E6986 presents for annual exam. The patient has no gyn complaints today. In  the past 6 months she has been diagnosed with fatty liver disease and elevated liver enzymes. She stopped most medications to see if liver function would improve. Lastly she stopped her OCP birth control in December which has helped some. She is seeing a [facility] provider for treatment.   LMP: Patient's last menstrual period was 09/24/2024 (approximate). Average Interval: regular, 90 days Duration of flow: 4 days Heavy Menses: no Clots: no Intermenstrual Bleeding: no Postcoital Bleeding: no Dysmenorrhea: yes  The patient is sexually active. She currently uses none for contraception currently. She denies dyspareunia.  The patient does not perform self breast exams.  There is no notable family history of breast or ovarian cancer in her family.  The patient wears seatbelts: yes.   The patient has regular exercise: yes.  She walks regularly and is active with her children. She admits healthy diet, hydration and sleep. She does admit some stress with usual daily life- caring for children and being enrolled in school ([employer])  The patient denies current symptoms of depression.    Review of Systems: Review of Systems  Constitutional:  Negative for chills and fever.  HENT:  Negative for congestion, ear discharge, ear pain, hearing loss, sinus pain and sore throat.   Eyes:  Negative for blurred vision and double vision.  Respiratory:  Negative for cough, shortness of breath and wheezing.   Cardiovascular:  Negative for chest pain, palpitations and leg swelling.  Gastrointestinal:  Negative for abdominal pain, blood in stool, constipation, diarrhea, heartburn, melena, nausea and vomiting.  Genitourinary:  Negative  for dysuria, flank pain, frequency, hematuria and urgency.  Musculoskeletal:  Negative for back pain, joint pain and myalgias.  Skin:  Negative for itching and rash.  Neurological:  Negative for dizziness, tingling, tremors, sensory change, speech change, focal weakness, seizures, loss of consciousness, weakness and headaches.  Endo/Heme/Allergies:  Negative for environmental allergies. Does not bruise/bleed easily.  Psychiatric/Behavioral:  Negative for depression, hallucinations, memory loss, substance abuse and suicidal ideas. The patient is not nervous/anxious and does not have insomnia.     Past Medical History:  Patient Active Problem List   Diagnosis Date Noted Date Diagnosed   TIA (transient ischemic attack) 10/23/2024    Hepatic steatosis 06/01/2024    Vitamin D deficiency 05/26/2024    Transaminitis 05/06/2024    Prediabetes 05/06/2024    Class 1 obesity due to excess calories without serious comorbidity with body mass index (BMI) of 31.0 to 31.9 in adult 05/05/2024    Anxiety 05/05/2024    Seasonal allergies 01/13/2024    Eczema 01/13/2024    Umbilical hernia without obstruction and without gangrene 11/13/2023    Intermittent abdominal pain 11/13/2023    Diastasis recti 11/13/2023    Carpal tunnel syndrome on right 05/29/2023    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 Added automatically from request for surgery [id number]     Past Surgical History:  Past Surgical History:  Procedure Laterality Date   CHOLECYSTECTOMY  04/24/2011   DILATION AND CURETTAGE OF UTERUS     for [facility] after elective  AB    Gynecologic History:  Patient's last menstrual period was 09/24/2024 (approximate). Contraception: OCP (estrogen/progesterone) Last Pap: 12/06/21 Results were: no abnormalities   Obstetric History: [id number]  Family History:  Family History  Problem Relation Age of Onset   Stroke Mother 85    Diabetes Mother        ?CHTN   Lung cancer Maternal Aunt    Lung cancer Maternal Grandmother    Heart attack Maternal Grandfather    Pancreatic cancer Other     Social History:  Social History   Socioeconomic History   Marital status: Single    Spouse name: Not on file   Number of children: 2   Years of education: Not on file   Highest education level: Not on file  Occupational History   Occupation: Nurse, [employer]  Tobacco Use   Smoking status: Never   Smokeless tobacco: Never  Vaping Use   Vaping status: Never Used  Substance and Sexual Activity   Alcohol use: No   Drug use: No   Sexual activity: Yes    Partners: Male    Birth control/protection: None  Other Topics Concern   Not on file  Social History Narrative   Not on file   Social Drivers of Health   Tobacco Use: Low Risk (10/23/2024)   Patient History    Smoking Tobacco Use: Never    Smokeless Tobacco Use: Never    Passive Exposure: Not on file  Financial Resource Strain: Low Risk  (05/26/2024)   Received from [facility]   Overall Financial Resource Strain (CARDIA)    Difficulty of Paying Living Expenses: Not hard at all  Food Insecurity: No Food Insecurity (05/26/2024)   Received from [facility]   Epic    Within the past 12 months, you worried that your food would run out before you got the money to buy more.: Never true    Within the past 12 months, the food you bought just didn't last and you didn't have money to get more.: Never true  Recent Concern: Food Insecurity - Food Insecurity Present (05/13/2024)   Received from [facility]   Epic    Within the past 12 months, you worried that your food would run out before you got the money to buy more.: Sometimes true    Within the past 12 months, the food you bought just didn't last and you didn't have money to get more.: Sometimes true  Transportation Needs: No Transportation Needs (05/26/2024)   [facility]    In the past 12 months, has lack of transportation kept you from medical appointments or from getting medications?: No    Lack of Transportation (Non-Medical): No  Physical Activity: Not on file  Stress: Not on file  Social Connections: Not on file  Intimate Partner Violence: Not on file  Depression (PHQ2-9): Low Risk (10/20/2024)   Depression (PHQ2-9)    PHQ-2 Score: 0  Alcohol Screen: Not on file  Housing: Low Risk  (05/26/2024)   Received from [facility]   Epic    In the last 12 months, was there a time when you were not able to pay the mortgage or rent on time?: No    In the past 12 months, how many times have you moved where you were living?: 0    At any time in the past 12 months, were you homeless or  living in a shelter (including now)?: No  Utilities: Not At Risk (05/26/2024)   Received from [facility]   Epic    In the past 12 months has the electric, gas, oil, or water company threatened to shut off services in your home?: No  Health Literacy: Not on file    Allergies:  Allergies[1]  Medications: Prior to Admission medications  Medication Sig Start Date End Date Taking? Authorizing Provider  albuterol  (VENTOLIN  HFA) 108 (90 Base) MCG/ACT inhaler Inhale 2 puffs into the lungs every 4 (four) hours as needed. 11/21/23   Brimage, Vondra, DO  ASHLYNA  0.15-0.03 &0.01 MG tablet Take 1 tablet by mouth daily. 09/13/23   Lynda Bradley, CNM  benzonatate  (TESSALON ) 200 MG capsule Take 1 capsule (200 mg total) by mouth 3 (three) times daily as needed. 06/11/24   Mayer, Jodi R, NP  escitalopram (LEXAPRO) 5 MG tablet Take 5 mg by mouth. 05/26/24 08/24/24  [provider]  meloxicam (MOBIC) 15 MG tablet Take 15 mg by mouth daily.    [provider]  ondansetron  (ZOFRAN -ODT) 4 MG disintegrating tablet Take 1 tablet (4 mg total) by mouth every 8 (eight) hours as needed. 11/21/23   Brimage,  Vondra, DO  propranolol (INDERAL) 10 MG tablet Take by mouth. 10/07/23 10/06/24  [provider]    Physical Exam Vitals: BP 114/76   Pulse 80   Ht 5' 3 (1.6 m)   Wt 176 lb 9.6 oz (80.1 kg)   LMP 09/24/2024 (Approximate)   BMI 31.28 kg/m   General: NAD HEENT: normocephalic, anicteric Thyroid : no enlargement, no palpable nodules Pulmonary: No increased work of breathing, CTAB Cardiovascular: RRR, distal pulses 2+ Breast: Breast symmetrical, no tenderness, no palpable nodules or masses, no skin or nipple retraction present, no nipple discharge.  No axillary or supraclavicular lymphadenopathy. Abdomen: NABS, soft, non-tender, non-distended.  Umbilicus without lesions.  No hepatomegaly, splenomegaly or masses palpable. No evidence of hernia  Genitourinary:  External: Normal external female genitalia.  Normal urethral meatus, normal Bartholin's and Skene's glands.    Vagina: Normal vaginal mucosa, no evidence of prolapse.    Cervix: Grossly normal in appearance, no bleeding  Uterus: Non-enlarged, mobile, normal contour.  No CMT  Adnexa: ovaries non-enlarged, no adnexal masses  Rectal: deferred  Lymphatic: no evidence of inguinal lymphadenopathy Extremities: no edema, erythema, or tenderness Neurologic: Grossly intact Psychiatric: mood appropriate, affect full  PHQ 2 & 9 Depression Scale- Over the past 2 weeks, how often have you been bothered by any of the following problems? Little interest or pleasure in doing things: 0 Feeling down, depressed, or hopeless (PHQ Adolescent also includes...irritable): 0 PHQ-2 Total Score: 0   Urine pregnancy test: negative   Assessment: 29 y.o. H5E6986 routine annual exam  Plan: Problem List Items Addressed This Visit   None Visit Diagnoses       Well woman exam with routine gynecological exam    -  Primary   Relevant Medications   norelgestromin -ethinyl estradiol  (XULANE) 150-35 MCG/24HR transdermal patch     Cervical cancer  screening       Relevant Orders   Cytology - PAP (Completed)     Depression screening         Need for HPV vaccine       Relevant Orders   HPV 9-valent vaccine,Recombinat (Completed)   POCT urine pregnancy     Screening examination for STI       Relevant Orders   Cytology - PAP (Completed)  Encounter for initial prescription of transdermal patch hormonal contraceptive device       Relevant Medications   norelgestromin -ethinyl estradiol  (XULANE) 150-35 MCG/24HR transdermal patch       1) STI screening  was offered and accepted  2)  ASCCP guidelines and rationale discussed.  Patient opts for every 3 years screening interval  3) Contraception - the patient is currently using  None.  She is interested in changing to Xulane patch  4) Routine healthcare maintenance including cholesterol, diabetes screening discussed Declines  5) Return in about 1 year (around 10/20/2025) for annual established gyn.   Slater Rains, CNM 10/23/2024 4:56 PM           [1]  Allergies Allergen Reactions   Cat Dander Hives and Shortness Of Breath    wheezing   Vancomycin Swelling   Tramadol Hives   "

## 2024-10-22 ENCOUNTER — Encounter: Payer: Self-pay | Admitting: Advanced Practice Midwife

## 2024-10-22 LAB — CYTOLOGY - PAP
Chlamydia: NEGATIVE
Comment: NEGATIVE
Comment: NEGATIVE
Comment: NEGATIVE
Comment: NEGATIVE
Comment: NORMAL
Diagnosis: NEGATIVE
HPV 16: NEGATIVE
HPV 18 / 45: NEGATIVE
High risk HPV: POSITIVE — AB
Neisseria Gonorrhea: NEGATIVE

## 2024-10-23 ENCOUNTER — Encounter: Payer: Self-pay | Admitting: Advanced Practice Midwife

## 2024-10-23 ENCOUNTER — Ambulatory Visit: Payer: Self-pay | Admitting: Advanced Practice Midwife

## 2024-10-23 DIAGNOSIS — G459 Transient cerebral ischemic attack, unspecified: Secondary | ICD-10-CM | POA: Insufficient documentation

## 2024-10-23 LAB — POCT URINE PREGNANCY: Preg Test, Ur: NEGATIVE

## 2024-10-23 NOTE — Addendum Note (Signed)
 Addended by: TAFT CAMELIA MATSU on: 10/23/2024 05:23 PM   Modules accepted: Orders

## 2024-11-06 ENCOUNTER — Ambulatory Visit: Admitting: Advanced Practice Midwife

## 2024-12-22 ENCOUNTER — Ambulatory Visit

## 2025-02-16 ENCOUNTER — Ambulatory Visit
